# Patient Record
Sex: Male | Born: 1995 | Race: Black or African American | Hispanic: No
Health system: Western US, Academic
[De-identification: ages and names within clinical notes are randomized; demographics above are authoritative.]

## PROBLEM LIST (undated history)

## (undated) ENCOUNTER — Emergency Department (HOSPITAL_COMMUNITY): Admission: EM | Payer: 59 | Source: Home / Self Care

## (undated) DIAGNOSIS — J45909 Unspecified asthma, uncomplicated: Secondary | ICD-10-CM

## (undated) DIAGNOSIS — J302 Other seasonal allergic rhinitis: Secondary | ICD-10-CM

---

## 2015-03-15 ENCOUNTER — Emergency Department (HOSPITAL_COMMUNITY)
Admission: EM | Admit: 2015-03-15 | Discharge: 2015-03-16 | Discharge status: Against Medical Advice | Payer: Self-pay | Attending: Emergency Medicine | Admitting: Emergency Medicine

## 2015-03-15 ENCOUNTER — Encounter (HOSPITAL_COMMUNITY): Payer: Self-pay

## 2015-03-15 ENCOUNTER — Emergency Department (HOSPITAL_COMMUNITY): Payer: Self-pay

## 2015-03-15 DIAGNOSIS — R1110 Vomiting, unspecified: Secondary | ICD-10-CM | POA: Insufficient documentation

## 2015-03-15 DIAGNOSIS — J45901 Unspecified asthma with (acute) exacerbation: Secondary | ICD-10-CM | POA: Insufficient documentation

## 2015-03-15 HISTORY — DX: Unspecified asthma, uncomplicated: J45.909

## 2015-03-15 NOTE — ED Notes (Signed)
Pt presents with c/o shortness of breath that he reported started at work earlier today. Pt reports a hx of asthma, lung sounds clear at this time. Pt denies any hx of anxiety but appears to be very anxious in triage. Pt reports that it has been harder for him to breathe lately and he has been using his dads inhaler. Pt also c/o vomiting.

## 2016-08-11 ENCOUNTER — Emergency Department (HOSPITAL_COMMUNITY): Payer: Medicaid Other

## 2016-08-11 ENCOUNTER — Encounter (HOSPITAL_COMMUNITY): Payer: Self-pay

## 2016-08-11 ENCOUNTER — Emergency Department (HOSPITAL_COMMUNITY)
Admission: EM | Admit: 2016-08-11 | Discharge: 2016-08-11 | Disposition: A | Discharge status: Home / Self Care | Payer: Medicaid Other | Attending: Emergency Medicine | Admitting: Emergency Medicine

## 2016-08-11 DIAGNOSIS — J4520 Mild intermittent asthma, uncomplicated: Secondary | ICD-10-CM | POA: Insufficient documentation

## 2016-08-11 DIAGNOSIS — R0602 Shortness of breath: Secondary | ICD-10-CM | POA: Diagnosis present

## 2016-08-11 HISTORY — DX: Other seasonal allergic rhinitis: J30.2

## 2016-08-11 LAB — BASIC METABOLIC PANEL
Anion gap: 10 (ref 5–15)
BUN: 7 mg/dL (ref 6–20)
CHLORIDE: 109 mmol/L (ref 101–111)
CO2: 22 mmol/L (ref 22–32)
CREATININE: 1.1 mg/dL (ref 0.61–1.24)
Calcium: 8.5 mg/dL — ABNORMAL LOW (ref 8.9–10.3)
Glucose, Bld: 110 mg/dL — ABNORMAL HIGH (ref 65–99)
POTASSIUM: 3.1 mmol/L — AB (ref 3.5–5.1)
SODIUM: 141 mmol/L (ref 135–145)

## 2016-08-11 LAB — CBC
HCT: 44.4 % (ref 39.0–52.0)
Hemoglobin: 16.4 g/dL (ref 13.0–17.0)
MCH: 32.7 pg (ref 26.0–34.0)
MCHC: 36.9 g/dL — ABNORMAL HIGH (ref 30.0–36.0)
MCV: 88.4 fL (ref 78.0–100.0)
PLATELETS: 182 10*3/uL (ref 150–400)
RBC: 5.02 MIL/uL (ref 4.22–5.81)
RDW: 12.4 % (ref 11.5–15.5)
WBC: 5.6 10*3/uL (ref 4.0–10.5)

## 2016-08-11 MED ORDER — METHYLPREDNISOLONE SODIUM SUCC 125 MG IJ SOLR
125.0000 mg | Freq: Once | INTRAMUSCULAR | Status: AC
  Administered 2016-08-11: 125 mg via INTRAVENOUS
  Filled 2016-08-11: qty 2

## 2016-08-11 MED ORDER — SODIUM CHLORIDE 0.9 % IV BOLUS (SEPSIS)
1000.0000 mL | Freq: Once | INTRAVENOUS | Status: AC
  Administered 2016-08-11: 1000 mL via INTRAVENOUS

## 2016-08-11 MED ORDER — DEXAMETHASONE SODIUM PHOSPHATE 4 MG/ML IJ SOLN: 16.0000 mg | Freq: Once | INTRAMUSCULAR | Status: DC

## 2016-08-11 MED ORDER — ALBUTEROL SULFATE (2.5 MG/3ML) 0.083% IN NEBU
5.0000 mg | INHALATION_SOLUTION | Freq: Once | RESPIRATORY_TRACT | Status: AC
  Administered 2016-08-11: 5 mg via RESPIRATORY_TRACT
  Filled 2016-08-11: qty 6

## 2016-08-11 MED ORDER — ALBUTEROL SULFATE HFA 108 (90 BASE) MCG/ACT IN AERS
1.0000 | INHALATION_SPRAY | Freq: Once | RESPIRATORY_TRACT | Status: AC
  Administered 2016-08-11: 1 via RESPIRATORY_TRACT
  Filled 2016-08-11: qty 6.7

## 2016-08-11 MED ORDER — POTASSIUM CHLORIDE CRYS ER 20 MEQ PO TBCR
40.0000 meq | EXTENDED_RELEASE_TABLET | Freq: Once | ORAL | Status: AC
  Administered 2016-08-11: 40 meq via ORAL
  Filled 2016-08-11: qty 2

## 2016-08-11 MED ORDER — PREDNISONE 20 MG PO TABS: 60.0000 mg | ORAL_TABLET | Freq: Every day | ORAL | 0 refills | Status: DC

## 2016-08-11 MED ORDER — IPRATROPIUM BROMIDE 0.02 % IN SOLN
0.5000 mg | Freq: Once | RESPIRATORY_TRACT | Status: AC
  Administered 2016-08-11: 0.5 mg via RESPIRATORY_TRACT
  Filled 2016-08-11: qty 2.5

## 2016-08-11 MED ORDER — ALBUTEROL (5 MG/ML) CONTINUOUS INHALATION SOLN
10.0000 mg/h | INHALATION_SOLUTION | Freq: Once | RESPIRATORY_TRACT | Status: AC
  Administered 2016-08-11: 10 mg/h via RESPIRATORY_TRACT
  Filled 2016-08-11: qty 20

## 2016-08-11 NOTE — ED Notes (Signed)
Pt was walked to radiology and when he was walking back he began having sob with some coughing.  Pt was helped back to room with a wheelchair.  EDPA notified.  Pt HR 140, spo2 100%. EDPA to the bedside and notified me that pt needs to be moved to the back

## 2016-08-11 NOTE — ED Notes (Signed)
O2 saturation was 97-98% while ambulating

## 2016-08-11 NOTE — Progress Notes (Signed)
Patient given  of albuterol. Patient told this RT that he took 4-6 puffs of albuterol inhaler prior to coming to ED.

## 2016-08-11 NOTE — ED Notes (Signed)
Pt is sweaty, and tachypneic, he has intermittent coughing and is on all 4s in the room, he is able to answer me in one word answers.  Spo2 is 100%.  Pt has numbness and stiffness in his hands, mask given and advised pt to attempt to slow his breathing to avoid this.  Pt helped into wheelchair and to room 3.  On arrival to room 3 pt is already some calmer and HR is down

## 2016-08-11 NOTE — ED Notes (Signed)
Asthma attack, adjusted acuity

## 2016-08-11 NOTE — ED Provider Notes (Signed)
[facility]-EMERGENCY DEPT Provider Note   CSN: [id number] Arrival date & time: 08/11/16  1214     History   Chief Complaint Chief Complaint  Patient presents with  . Asthma  . Shortness of Breath    HPI Dustin Christian is a 21 y.o. male.  HPI  21 y.o. male with a hx of Asthma, presents to the Emergency Department today complaining of shortness of breath x 3 weeks. Worsening last night. Notes asthma attacks x 3 with use of parents Symbicort to help with symptoms with minimal relief. Denies URI symptoms. No cough/congestion. No N/V/D. No active chest pain. No other symptoms noted. Difficult to obtain HPI due to acuity of condition. Pt tripoding with accessory muscle usage.   Past Medical History:  Diagnosis Date  . Asthma   . Seasonal allergies     There are no active problems to display for this patient.   History reviewed. No pertinent surgical history.     Home Medications    Prior to Admission medications   Not on File    Family History History reviewed. No pertinent family history.  Social History Social History  Substance Use Topics  . Smoking status: Never Smoker  . Smokeless tobacco: Never Used  . Alcohol use No     Allergies   Patient has no known allergies.   Review of Systems Review of Systems ROS reviewed and all are negative for acute change except as noted in the HPI.  Physical Exam Updated Vital Signs BP (!) 135/96   Pulse (!) 126   Temp 98.2 F (36.8 C) (Oral)   Resp (!) 22   Ht  (1.753 m)   Wt 77.1 kg   SpO2 99%   BMI 25.10 kg/m   Physical Exam  Constitutional: He is oriented to person, place, and time. Vital signs are normal. He appears well-developed and well-nourished. No distress.  Tripoding. Accessory muscle usage   HENT:  Head: Normocephalic and atraumatic.  Right Ear: Hearing, tympanic membrane, external ear and ear canal normal.  Left Ear: Hearing, tympanic membrane, external ear and ear canal normal.  Nose: Nose  normal.  Mouth/Throat: Uvula is midline, oropharynx is clear and moist and mucous membranes are normal. No trismus in the jaw. No oropharyngeal exudate, posterior oropharyngeal erythema or tonsillar abscesses.  Eyes: Conjunctivae and EOM are normal. Pupils are equal, round, and reactive to light.  Neck: Normal range of motion. Neck supple. No tracheal deviation present.  Cardiovascular: Regular rhythm, S1 normal, S2 normal, normal heart sounds, intact distal pulses and normal pulses.  Tachycardia present.   Pulmonary/Chest: Accessory muscle usage present. Tachypnea noted. He is in respiratory distress. He has decreased breath sounds in the right upper field, the right lower field, the left upper field and the left lower field. He has no wheezes. He has no rhonchi. He has no rales.  Abdominal: Normal appearance and bowel sounds are normal. There is no tenderness.  Musculoskeletal: Normal range of motion.  Neurological: He is alert and oriented to person, place, and time.  Skin: Skin is warm and dry.  Psychiatric: He has a normal mood and affect. His speech is normal and behavior is normal. Thought content normal.  Nursing note and vitals reviewed.  ED Treatments / Results  Labs (all labs ordered are listed, but only abnormal results are displayed) Labs Reviewed  CBC - Abnormal; Notable for the following:       Result Value   MCHC 36.9 (*)  All other components within normal limits  BASIC METABOLIC PANEL    EKG  EKG Interpretation None       Radiology Dg Chest 2 View  Result Date: 08/11/2016 CLINICAL DATA:  History of asthma.  Shortness of breath for 1 day. EXAM: CHEST  2 VIEW COMPARISON:  03/15/2015 FINDINGS: Heart and mediastinal contours are within normal limits. No focal opacities or effusions. No acute bony abnormality. IMPRESSION: No active cardiopulmonary disease. Electronically Signed   By: Charlett Nose M.D.   On: 08/11/2016 13:28    Procedures Procedures (including  critical care time)  CRITICAL CARE Performed by: Eston Esters   Total critical care time: 35 minutes  Critical care time was exclusive of separately billable procedures and treating other patients.  Critical care was necessary to treat or prevent imminent or life-threatening deterioration.  Critical care was time spent personally by me on the following activities: development of treatment plan with patient and/or surrogate as well as nursing, discussions with consultants, evaluation of patient's response to treatment, examination of patient, obtaining history from patient or surrogate, ordering and performing treatments and interventions, ordering and review of laboratory studies, ordering and review of radiographic studies, pulse oximetry and re-evaluation of patient's condition.   Medications Ordered in ED Medications  albuterol (PROVENTIL HFA;VENTOLIN HFA) 108 (90 Base) MCG/ACT inhaler 1 puff (not administered)  sodium chloride 0.9 % bolus 1,000 mL (not administered)  albuterol (PROVENTIL) (2.5 MG/3ML) 0.083% nebulizer solution 5 mg (5 mg Nebulization Given 08/11/16 1238)  methylPREDNISolone sodium succinate (SOLU-MEDROL) 125 mg/2 mL injection 125 mg (125 mg Intravenous Given 08/11/16 1356)  albuterol (PROVENTIL,VENTOLIN) solution continuous neb (10 mg/hr Nebulization Given 08/11/16 1332)  sodium chloride 0.9 % bolus 1,000 mL (1,000 mLs Intravenous New Bag/Given 08/11/16 1356)  ipratropium (ATROVENT) nebulizer solution 0.5 mg (0.5 mg Nebulization Given 08/11/16 1332)    Initial Impression / Assessment and Plan / ED Course  I have reviewed the triage vital signs and the nursing notes.  Pertinent labs & imaging results that were available during my care of the patient were reviewed by me and considered in my medical decision making (see chart for details).  Final Clinical Impressions(s) / ED Diagnoses  {I have reviewed and evaluated the relevant laboratory values. {I have reviewed and  evaluated the relevant imaging studies.  {I have reviewed the relevant previous healthcare records.  {I obtained HPI from historian.   ED Course:  Assessment: Pt is a 21 y.o. male with hx Asthma who presents with acute asthma exacerbation x last night. Notes symptoms x2-3 weeks. Attempted relief with parents symbicort. On exam, Tripoding with active WOB. Accessory muscle usage. VS with tachyardia. Afebrile. Lungs decreased breath sounds bilaterally. Wheeze noted on triage with administration of albuterol. Abdomen nontender soft. Given continuous neb treatment, solumedrol with significant improvement of accessory muscle use and tripoding. CBC unremarkable. BMP pending. CXR shows no active cardiopulmonary disease. Discussed with supervising physician. Anticipate DC home. Given fluids IV and PO to get HR down due to albuterol administration. Pt well appearing and ambulating at 94-96% on RA. Doubt ACS or PE as patient improved significantly with breathing treatments. If HR goes down, DC home with steroids and albuterol. If unchanged, will admit to medicine for observation. Pt does state that he has been admitted in the past for asthma exacerbations. Seen by supervising physician who agrees with plan.   Disposition/Plan:  3:01 PM Sign out: Shanna Cisco, PA-C  Supervising Physician Derwood Kaplan, MD  Final diagnoses:  Mild  intermittent asthma, unspecified whether complicated    New Prescriptions New Prescriptions   No medications on file     Audry Pili, PA-C 08/11/16 1521    Derwood Kaplan, MD 08/12/16 0900    Derwood Kaplan, MD 08/12/16 [phone]

## 2016-08-11 NOTE — Discharge Instructions (Signed)
Please read and follow all provided instructions.  Your diagnoses today include:  1. Mild intermittent asthma, unspecified whether complicated    Tests performed today include: Chest Xray Vital signs. See below for your results today.   Medications prescribed:   Take any prescribed medications only as directed.  Home care instructions:  Follow any educational materials contained in this packet.  Follow-up instructions: Please follow-up with your primary care provider in the next 3 days for further evaluation of your symptoms and management of your asthma.  Return instructions:  Please return to the Emergency Department if you experience worsening symptoms. Please return with worsening wheezing, shortness of breath, or difficulty breathing. Return with persistent fever above 101F.  Please return if you have any other emergent concerns.  Additional Information:  Your vital signs today were: BP (!) 135/96    Pulse (!) 126    Temp 98.2 F (36.8 C) (Oral)    Resp (!) 22    Ht  (1.753 m)    Wt 77.1 kg    SpO2 99%    BMI 25.10 kg/m  If your blood pressure (BP) was elevated above 135/85 this visit, please have this repeated by your doctor within one month. --------------

## 2016-08-11 NOTE — ED Provider Notes (Signed)
Care assumed from previous provider PA [facility]. Please see note for further details. Briefly, patient is a 21 y.o. male with history of asthma who presents to ED for shortness of breath c/w asthma exacerbation. Given steroids and continuous neb by prior provider. Patient feeling better. BMP pending. HR in 150's. Case discussed, plan agreed upon. Will follow up on BMP, re-evaluate after 1L fluids. If HR improved and able to ambulate without desat, likely home with rx for steroid burst and inhaler.   BMP with K+ of 3.1, otherwise reassuring. K+ replenished.   Patient re-evaluated. HR 105-112. Feels improved and comfortable with discharge to home. Father comfortable with discharge home as well. Rx for steroid burst given. Reasons to return to ER discussed and all questions answered.    [facility] Yanelis Osika, PA-C 08/11/16 [phone]

## 2016-08-11 NOTE — ED Triage Notes (Signed)
Pt with hx of asthma.  No inhaler.  Shortness of breath last night and today with described asthma attack.  Used dad's inhaler.

## 2016-08-11 NOTE — ED Notes (Signed)
Family at bedside. 

## 2016-09-20 ENCOUNTER — Ambulatory Visit: Payer: Self-pay | Admitting: Family Medicine

## 2017-03-27 ENCOUNTER — Other Ambulatory Visit: Payer: Self-pay

## 2017-03-27 ENCOUNTER — Emergency Department (HOSPITAL_COMMUNITY): Payer: Self-pay

## 2017-03-27 ENCOUNTER — Encounter (HOSPITAL_COMMUNITY): Payer: Self-pay | Admitting: Emergency Medicine

## 2017-03-27 ENCOUNTER — Emergency Department (HOSPITAL_COMMUNITY)
Admission: EM | Admit: 2017-03-27 | Discharge: 2017-03-27 | Disposition: A | Discharge status: Home / Self Care | Payer: Self-pay | Attending: Emergency Medicine | Admitting: Emergency Medicine

## 2017-03-27 DIAGNOSIS — W010XXA Fall on same level from slipping, tripping and stumbling without subsequent striking against object, initial encounter: Secondary | ICD-10-CM | POA: Insufficient documentation

## 2017-03-27 DIAGNOSIS — S6991XA Unspecified injury of right wrist, hand and finger(s), initial encounter: Secondary | ICD-10-CM | POA: Insufficient documentation

## 2017-03-27 DIAGNOSIS — J45909 Unspecified asthma, uncomplicated: Secondary | ICD-10-CM | POA: Insufficient documentation

## 2017-03-27 DIAGNOSIS — Y92481 Parking lot as the place of occurrence of the external cause: Secondary | ICD-10-CM | POA: Insufficient documentation

## 2017-03-27 DIAGNOSIS — M25531 Pain in right wrist: Secondary | ICD-10-CM | POA: Insufficient documentation

## 2017-03-27 DIAGNOSIS — Y998 Other external cause status: Secondary | ICD-10-CM | POA: Insufficient documentation

## 2017-03-27 DIAGNOSIS — Z79899 Other long term (current) drug therapy: Secondary | ICD-10-CM | POA: Insufficient documentation

## 2017-03-27 DIAGNOSIS — Y9389 Activity, other specified: Secondary | ICD-10-CM | POA: Insufficient documentation

## 2017-03-27 MED ORDER — IBUPROFEN 200 MG PO TABS
600.0000 mg | ORAL_TABLET | Freq: Once | ORAL | Status: AC
  Administered 2017-03-27: 600 mg via ORAL
  Filled 2017-03-27: qty 3

## 2017-03-27 NOTE — Discharge Instructions (Signed)
Please read instructions below. Apply ice to your wrist for 20 minutes at a time. You can take advil/ibuprofen every 6 hours as needed for pain. Schedule an appointment with the orthopedic specialist in 1 week if symptoms persist.  Return to the ER for new or concerning symptoms.

## 2017-03-27 NOTE — ED Notes (Signed)
Bed: WTR5 Expected date:  Expected time:  Means of arrival:  Comments: 21 yo wrist pain

## 2017-03-27 NOTE — ED Provider Notes (Signed)
[facility]-EMERGENCY DEPT Provider Note   CSN: [id number] Arrival date & time: 03/27/17  1446     History   Chief Complaint Chief Complaint  Patient presents with  . Wrist Pain    HPI Dustin Christian is a 21 y.o. male to the ED with acute onset of right wrist pain began prior to arrival.  Patient states he was pushing a car out of the snow and slipped and fell down onto his right side with his right arm tucked under him. No head trauma. He localizes pain to the medial aspect of the right wrist and distal forearm.  Pain is worse with movement and palpation.  No medications tried prior to arrival.  Denies numbness or tingling.  States he is right-hand dominant.  No previous injuries to this arm. The history is provided by the patient.    Past Medical History:  Diagnosis Date  . Asthma   . Seasonal allergies     There are no active problems to display for this patient.   History reviewed. No pertinent surgical history.     Home Medications    Prior to Admission medications   Medication Sig Start Date End Date Taking? Authorizing Provider  albuterol (PROVENTIL HFA;VENTOLIN HFA) 108 (90 Base) MCG/ACT inhaler Inhale 2 puffs into the lungs every 6 (six) hours as needed for wheezing or shortness of breath.    [provider]  diphenhydrAMINE (BENADRYL) 25 MG tablet Take 50 mg by mouth every 6 (six) hours as needed for itching or allergies.    [provider]  predniSONE (DELTASONE) 20 MG tablet Take 3 tablets (60 mg total) by mouth daily. 08/11/16   Ward, Chase PicketJaime Pilcher, PA-C    Family History No family history on file.  Social History Social History   Tobacco Use  . Smoking status: Never Smoker  . Smokeless tobacco: Never Used  Substance Use Topics  . Alcohol use: No  . Drug use: No     Allergies   Patient has no known allergies.   Review of Systems Review of Systems  Musculoskeletal: Positive for arthralgias.  Neurological:  Negative for numbness.     Physical Exam Updated Vital Signs BP 109/61 (BP Location: Left Arm)   Pulse 81   Temp 98.7 F (37.1 C) (Oral)   Resp 13   Ht 5\' 10"  (1.778 m)   Wt 81.6 kg (180 lb)   SpO2 99%   BMI 25.83 kg/m   Physical Exam  Constitutional: He appears well-developed and well-nourished. No distress.  HENT:  Head: Normocephalic and atraumatic.  Eyes: Conjunctivae are normal.  Cardiovascular: Normal rate and intact distal pulses.  Pulmonary/Chest: Effort normal.  Abdominal: Soft.  Musculoskeletal:  Ulnar styloid with TTP, distal Ulnar shaft and 5th metacarpal  with TTP. No significant edema. No deformity. No wound. No color change. Good capillary refill. Normal ROM of all digits. Normal internal/external rotation of forearm, elbow with nl ROM.   Neurological: He is alert. No sensory deficit.  Skin: Skin is warm.  Psychiatric: He has a normal mood and affect. His behavior is normal.  Nursing note and vitals reviewed.    ED Treatments / Results  Labs (all labs ordered are listed, but only abnormal results are displayed) Labs Reviewed - No data to display  EKG  EKG Interpretation None       Radiology Dg Forearm Right  Result Date: 03/27/2017 CLINICAL DATA:  Was pushing a car and fell onto his RIGHT-side, pain and  numbness from fingers into forearm EXAM: RIGHT FOREARM - 2 VIEW COMPARISON:  None FINDINGS: Osseous mineralization normal. Joint spaces preserved. No fracture, dislocation, or bone destruction. IMPRESSION: Normal exam. Electronically Signed   By: Ulyses SouthwardMark  Boles M.D.   On: 03/27/2017 15:30   Dg Wrist Complete Right  Result Date: 03/27/2017 CLINICAL DATA:  Was pushing a car and fell onto his RIGHT-side, pain and numbness from fingers into forearm EXAM: RIGHT WRIST - COMPLETE 3+ VIEW COMPARISON:  None FINDINGS: Osseous mineralization normal. Joint spaces preserved. No acute fracture, dislocation or bone destruction. Oblique positioning on lateral view.  IMPRESSION: No acute osseous abnormalities. Electronically Signed   By: Ulyses SouthwardMark  Boles M.D.   On: 03/27/2017 15:30    Procedures Procedures (including critical care time)  Medications Ordered in ED Medications  ibuprofen (ADVIL,MOTRIN) tablet 600 mg (not administered)     Initial Impression / Assessment and Plan / ED Course  I have reviewed the triage vital signs and the nursing notes.  Pertinent labs & imaging results that were available during my care of the patient were reviewed by me and considered in my medical decision making (see chart for details).    Pt with R wrist pain s/p mechanical fall. NV intact. X-Ray negative for obvious fracture or dislocation. Pain managed in ED. Pt advised to follow up with orthopedics if symptoms persist for possibility of missed fracture diagnosis. Patient given brace while in ED, conservative therapy recommended and discussed. Patient will be dc home & is agreeable with above plan.  Discussed results, findings, treatment and follow up. Patient advised of return precautions. Patient verbalized understanding and agreed with plan.  Final Clinical Impressions(s) / ED Diagnoses   Final diagnoses:  Right wrist pain    ED Discharge Orders    None       Akanksha Bellmore, [place] N, PA-C 03/27/17 1537    Doug SouJacubowitz, Sam, MD 03/27/17 1557

## 2017-03-27 NOTE — ED Triage Notes (Signed)
Per EMS complaint of of right wrist pain from landing on arm while trying to plow car.

## 2017-03-27 NOTE — ED Notes (Addendum)
Pt is alert and oriented x 4 and is verbally responsive. Pt states that he was pushing a car and fell on the rt side of his body and is now having pain, numbness from his fingers to his forearm. Pt report 5/10 throbbing pain. +PMS to RUE. Pt has sling and immobile splint on rt forearm

## 2018-08-16 ENCOUNTER — Emergency Department (HOSPITAL_COMMUNITY): Payer: Medicaid Other

## 2018-08-16 ENCOUNTER — Other Ambulatory Visit: Payer: Self-pay

## 2018-08-16 ENCOUNTER — Encounter (HOSPITAL_COMMUNITY): Payer: Self-pay

## 2018-08-16 ENCOUNTER — Emergency Department (HOSPITAL_COMMUNITY)
Admission: EM | Admit: 2018-08-16 | Discharge: 2018-08-16 | Disposition: A | Discharge status: Home / Self Care | Payer: Medicaid Other | Attending: Emergency Medicine | Admitting: Emergency Medicine

## 2018-08-16 DIAGNOSIS — Z79899 Other long term (current) drug therapy: Secondary | ICD-10-CM | POA: Insufficient documentation

## 2018-08-16 DIAGNOSIS — J45909 Unspecified asthma, uncomplicated: Secondary | ICD-10-CM | POA: Insufficient documentation

## 2018-08-16 DIAGNOSIS — Z20828 Contact with and (suspected) exposure to other viral communicable diseases: Secondary | ICD-10-CM | POA: Insufficient documentation

## 2018-08-16 DIAGNOSIS — R0602 Shortness of breath: Secondary | ICD-10-CM | POA: Insufficient documentation

## 2018-08-16 DIAGNOSIS — R0789 Other chest pain: Secondary | ICD-10-CM | POA: Insufficient documentation

## 2018-08-16 DIAGNOSIS — R05 Cough: Secondary | ICD-10-CM | POA: Insufficient documentation

## 2018-08-16 LAB — CBC
HCT: 45.3 % (ref 39.0–52.0)
Hemoglobin: 16.2 g/dL (ref 13.0–17.0)
MCH: 32.3 pg (ref 26.0–34.0)
MCHC: 35.8 g/dL (ref 30.0–36.0)
MCV: 90.4 fL (ref 80.0–100.0)
Platelets: 268 10*3/uL (ref 150–400)
RBC: 5.01 MIL/uL (ref 4.22–5.81)
RDW: 11.7 % (ref 11.5–15.5)
WBC: 3.9 10*3/uL — ABNORMAL LOW (ref 4.0–10.5)
nRBC: 0 % (ref 0.0–0.2)

## 2018-08-16 LAB — BASIC METABOLIC PANEL
Anion gap: 9 (ref 5–15)
BUN: 6 mg/dL (ref 6–20)
CO2: 23 mmol/L (ref 22–32)
Calcium: 9.4 mg/dL (ref 8.9–10.3)
Chloride: 106 mmol/L (ref 98–111)
Creatinine, Ser: 1.08 mg/dL (ref 0.61–1.24)
GFR calc Af Amer: >60 mL/min (ref >60)
GFR calc non Af Amer: >60 mL/min (ref >60)
Glucose, Bld: 104 mg/dL — ABNORMAL HIGH (ref 70–99)
Potassium: 4.3 mmol/L (ref 3.5–5.1)
Sodium: 138 mmol/L (ref 135–145)

## 2018-08-16 LAB — TROPONIN I: Troponin I: <0.03 ng/mL (ref <0.03)

## 2018-08-16 MED ORDER — ALBUTEROL SULFATE HFA 108 (90 BASE) MCG/ACT IN AERS: 2.0000 | INHALATION_SPRAY | Freq: Four times a day (QID) | RESPIRATORY_TRACT | 0 refills | Status: DC | PRN

## 2018-08-16 MED ORDER — ALBUTEROL SULFATE HFA 108 (90 BASE) MCG/ACT IN AERS
2.0000 | INHALATION_SPRAY | Freq: Once | RESPIRATORY_TRACT | Status: AC
  Administered 2018-08-16: 2 via RESPIRATORY_TRACT
  Filled 2018-08-16: qty 6.7

## 2018-08-16 NOTE — ED Provider Notes (Signed)
[facility] EMERGENCY DEPARTMENT Provider Note   CSN: [id number] Arrival date & time: 08/16/18  1909    History   Chief Complaint Chief Complaint  Patient presents with  . Shortness of Breath    HPI Jet Wagley is a 23 y.o. male with a PMH of Asthma and seasonal allergies presenting with intermittent shortness of breath onset 1 week ago. Patient reports an associated productive cough. Patient states he has been using nebulizer with relief. Patient states he ran out of his albuterol inhaler recently. Patient states smelling things makes symptoms worse. Patient reports intermittent mild central chest pressure. Patient denies fever, chills, nausea, vomiting, abdominal pain, diarrhea, or body aches. Patient denies congestion, rhinorrhea, or sore throat. Patient reports sick exposures at work with possible COVID-19. Patient denies recent travel. Patient denies using recent surgery, leg edema/pain, or hx of DVT/PE. Patient denies a personal or family history of heart problems.       HPI  Past Medical History:  Diagnosis Date  . Asthma   . Seasonal allergies     There are no active problems to display for this patient.   History reviewed. No pertinent surgical history.      Home Medications    Prior to Admission medications   Medication Sig Start Date End Date Taking? Authorizing Provider  albuterol (VENTOLIN HFA) 108 (90 Base) MCG/ACT inhaler Inhale 2 puffs into the lungs every 6 (six) hours as needed for wheezing or shortness of breath. 08/16/18   Ardyth Harps, Tiffiney Sparrow P, PA-C  diphenhydrAMINE (BENADRYL) 25 MG tablet Take 50 mg by mouth every 6 (six) hours as needed for itching or allergies.    [provider]  predniSONE (DELTASONE) 20 MG tablet Take 3 tablets (60 mg total) by mouth daily. 08/11/16   Ward, Chase Picket, PA-C    Family History History reviewed. No pertinent family history.  Social History Social History   Tobacco Use  . Smoking  status: Never Smoker  . Smokeless tobacco: Never Used  Substance Use Topics  . Alcohol use: No  . Drug use: No     Allergies   Patient has no known allergies.   Review of Systems Review of Systems  Constitutional: Negative for activity change, appetite change, chills, diaphoresis, fatigue, fever and unexpected weight change.  HENT: Negative for congestion and rhinorrhea.   Respiratory: Positive for cough and shortness of breath. Negative for chest tightness and wheezing.   Cardiovascular: Positive for chest pain. Negative for palpitations and leg swelling.  Gastrointestinal: Negative for abdominal pain, nausea and vomiting.  Endocrine: Negative for cold intolerance and heat intolerance.  Musculoskeletal: Negative for back pain.  Skin: Negative for rash.  Allergic/Immunologic: Negative for immunocompromised state.  Neurological: Negative for dizziness, syncope, weakness and light-headedness.  Psychiatric/Behavioral: Negative for agitation and behavioral problems. The patient is not nervous/anxious.    Physical Exam Updated Vital Signs BP 112/66   Pulse 64   Temp 98 F (36.7 C) (Oral)   Resp 16   Ht 5\' 11"  (1.803 m)   Wt 87.1 kg   SpO2 98%   BMI 26.78 kg/m   Physical Exam Vitals signs and nursing note reviewed.  Constitutional:      General: He is not in acute distress.    Appearance: He is well-developed. He is not diaphoretic.  HENT:     Head: Normocephalic and atraumatic.     Mouth/Throat:     Mouth: Mucous membranes are moist.     Pharynx: No pharyngeal  swelling or oropharyngeal exudate.  Neck:     Musculoskeletal: Normal range of motion and neck supple.     Vascular: No JVD.  Cardiovascular:     Rate and Rhythm: Normal rate and regular rhythm.     Pulses: Normal pulses.          Radial pulses are 2+ on the right side and 2+ on the left side.       Dorsalis pedis pulses are 2+ on the right side and 2+ on the left side.     Heart sounds: Normal heart sounds.  No murmur. No friction rub. No gallop.   Pulmonary:     Effort: Pulmonary effort is normal. No respiratory distress.     Breath sounds: Normal breath sounds. No decreased breath sounds, wheezing, rhonchi or rales.     Comments: Patient is speaking in full sentences without difficulty on room air. Oxygen saturation is 98% while in the room.  Chest:     Chest wall: No tenderness.  Abdominal:     Palpations: Abdomen is soft.     Tenderness: There is no abdominal tenderness.  Musculoskeletal: Normal range of motion.     Right lower leg: He exhibits no tenderness. No edema.     Left lower leg: He exhibits no tenderness. No edema.  Skin:    General: Skin is warm.     Capillary Refill: Capillary refill takes less than 2 seconds.     Coloration: Skin is not pale.     Findings: No rash.  Neurological:     Mental Status: He is alert.     ED Treatments / Results  Labs (all labs ordered are listed, but only abnormal results are displayed) Labs Reviewed  CBC - Abnormal; Notable for the following components:      Result Value   WBC 3.9 (*)    All other components within normal limits  BASIC METABOLIC PANEL - Abnormal; Notable for the following components:   Glucose, Bld 104 (*)    All other components within normal limits  TROPONIN I    EKG EKG Interpretation  Date/Time:  Wednesday August 16 2018 21:04:16 EDT Ventricular Rate:  61 PR Interval:    QRS Duration: 91 QT Interval:  375 QTC Calculation: 378 R Axis:   12 Text Interpretation:  Sinus rhythm ST elev, probable normal early repol pattern agree. appears similar to old except slower rate Confirmed by Arby BarrettePfeiffer, Marcy [phone]([id number]) on 08/16/2018 9:10:02 PM   Radiology Dg Chest 2 View  Result Date: 08/16/2018 CLINICAL DATA:  23 year old male with shortness of breath EXAM: CHEST - 2 VIEW COMPARISON:  08/11/2016 FINDINGS: The heart size and mediastinal contours are within normal limits. Both lungs are clear. The visualized skeletal  structures are unremarkable. IMPRESSION: Negative for acute cardiopulmonary disease Electronically Signed   By: Gilmer MorJaime  Wagner D.O.   On: 08/16/2018 19:55    Procedures Procedures (including critical care time)  Medications Ordered in ED Medications - No data to display   Initial Impression / Assessment and Plan / ED Course  I have reviewed the triage vital signs and the nursing notes.  Pertinent labs & imaging results that were available during my care of the patient were reviewed by me and considered in my medical decision making (see chart for details).  Clinical Course as of Aug 15 2336  Wed Aug 16, 2018  2137 No acute cardiopulmonary disease noted on CXR.  DG Chest 2 View [AH]    Clinical Course  User Index [AH] Leretha Dykes, PA-C      Patient is to be discharged with recommendation to follow up with PCP in regards to today's hospital visit. Shortness of breath and chest pain is not likely of cardiac or pulmonary etiology d/t presentation, PERC negative, VSS, no tracheal deviation, no JVD or new murmur, RRR, breath sounds equal bilaterally, EKG without acute abnormalities, negative troponin, and negative CXR. Pt has been advised to return to the ED if CP becomes exertional, associated with diaphoresis or nausea, radiates to left jaw/arm, worsens or becomes concerning in any way. Suspect symptoms are likely viral in nature. Oxygen saturations have remained stable while in the ER. Advised patient to quarantine until symptoms resolve due to possible COVID-19 exposures. Refilled albuterol inhaler per patient's request. Discussed strict return precautions. Patient states he understands and agrees with plan. Pt appears reliable for follow up and is agreeable to discharge.   Charvez Voorhies was evaluated in Emergency Department on 08/16/2018 for the symptoms described in the history of present illness. He was evaluated in the context of the global COVID-19 pandemic, which necessitated  consideration that the patient might be at risk for infection with the SARS-CoV-2 virus that causes COVID-19. Institutional protocols and algorithms that pertain to the evaluation of patients at risk for COVID-19 are in a state of rapid change based on information released by regulatory bodies including the CDC and federal and state organizations. These policies and algorithms were followed during the patient's care in the ED.  Final Clinical Impressions(s) / ED Diagnoses   Final diagnoses:  Shortness of breath  Atypical chest pain    ED Discharge Orders         Ordered    albuterol (VENTOLIN HFA) 108 (90 Base) MCG/ACT inhaler  Every 6 hours PRN     08/16/18 2337           Leretha Dykes, PA-C 08/16/18 2339    Arby Barrette, MD 08/17/18 1728

## 2018-08-16 NOTE — ED Triage Notes (Signed)
The pt is c/o some sob for one week  Hx of asthma he does not have an inhaler he ran out  No wheezing heard  He has had a productive cough white yellow no elevated temp

## 2018-08-16 NOTE — ED Notes (Signed)
No distress.

## 2018-08-16 NOTE — ED Triage Notes (Signed)
Pt arrives pov for eval of SOB since last Thursday w/ cough/chills/chest pressure. Pt denies sick contacts or travel. Previous triage note charted in error under Dustin Christian, NT. Information is accurate, charted by this RN.

## 2018-08-16 NOTE — ED Triage Notes (Signed)
Pt arrives pov for eval of SOB since last Thursday w/ cough/chills/chest pressure. Pt denies sick contacts or travel.

## 2018-08-16 NOTE — Discharge Instructions (Addendum)
You have been seen today for shortness of breath and chest pressure. Please read and follow all provided instructions.   1. Medications: inhaler for shortness of breath, usual home medications 2. Treatment: rest, drink plenty of fluids 3. Follow Up: Please follow up with your primary doctor in 2 days for discussion of your diagnoses and further evaluation after today's visit; if you do not have a primary care doctor use the resource guide provided to find one; Please return to the ER for any new or worsening symptoms. Please obtain all of your results from medical records or have your doctors office obtain the results - share them with your doctor - you should be seen at your doctors office. Call today to arrange your follow up.  4. Please follow instructions for isolation. We did not test you for COVID-19 (coronavirus), but it is still a possibility that you may have been exposed. Please isolate yourself for at least 3 days since recovery without a fever, improvement in respiratory symptoms (cough, shortness of breath), and at least 7 days have passed since symptoms first appeared.    Take medications as prescribed. Please review all of the medicines and only take them if you do not have an allergy to them. Return to the emergency room for worsening condition or new concerning symptoms. Follow up with your regular doctor. If you don't have a regular doctor use one of the numbers below to establish a primary care doctor.  Please be aware that if you are taking birth control pills, taking other prescriptions, ESPECIALLY ANTIBIOTICS may make the birth control ineffective - if this is the case, either do not engage in sexual activity or use alternative methods of birth control such as condoms until you have finished the medicine and your family doctor says it is OK to restart them. If you are on a blood thinner such as COUMADIN, be aware that any other medicine that you take may cause the coumadin to either  work too much, or not enough - you should have your coumadin level rechecked in next 7 days if this is the case.  ?  It is also a possibility that you have an allergic reaction to any of the medicines that you have been prescribed - Everybody reacts differently to medications and while MOST people have no trouble with most medicines, you may have a reaction such as nausea, vomiting, rash, swelling, shortness of breath. If this is the case, please stop taking the medicine immediately and contact your physician.  ?  You should return to the ER if you develop severe or worsening symptoms.   Emergency Department Resource Guide 1) Find a Doctor and Pay Out of Pocket Although you won't have to find out who is covered by your insurance plan, it is a good idea to ask around and get recommendations. You will then need to call the office and see if the doctor you have chosen will accept you as a new patient and what types of options they offer for patients who are self-pay. Some doctors offer discounts or will set up payment plans for their patients who do not have insurance, but you will need to ask so you aren't surprised when you get to your appointment.  2) Contact Your Local Health Department Not all health departments have doctors that can see patients for sick visits, but many do, so it is worth a call to see if yours does. If you don't know where your local health department  is, you can check in your phone book. The CDC also has a tool to help you locate your state's health department, and many state websites also have listings of all of their local health departments.  3) Find a Walk-in Clinic If your illness is not likely to be very severe or complicated, you may want to try a walk in clinic. These are popping up all over the country in pharmacies, drugstores, and shopping centers. They're usually staffed by nurse practitioners or physician assistants that have been trained to treat common illnesses  and complaints. They're usually fairly quick and inexpensive. However, if you have serious medical issues or chronic medical problems, these are probably not your best option.  No Primary Care Doctor: Call Health Connect at  [phone] - they can help you locate a primary care doctor that  accepts your insurance, provides certain services, etc. Physician Referral Service- [phone]  Chronic Pain Problems: Organization         Address  Phone   Notes  [facility]  [phone] Patients need to be referred by their primary care doctor.   Medication Assistance: Organization         Address  Phone   Notes  [facility] Medication [facility] [address] [place], [place] [address] [phone] --Must be a resident of [place] -- Must have NO insurance coverage whatsoever (no Medicaid/ Medicare, etc.) -- The pt. MUST have a primary care doctor that directs their care regularly and follows them in the community   [facility]  [phone]   [employer]  [phone]    Agencies that provide inexpensive medical care: Organization         Address  Phone   Notes  [facility]  [phone]   [facility]    [phone]   [facility] [address] [place], [place] [address] [phone]) [phone]   [facility] [address], [place] [phone]) [phone]   [facility]    [phone]) [phone]   [facility]    [phone]   [facility]  [address], [place] Phone:  [phone], Fax:  [phone] Hours of Operation:  9 am - 6 pm, M-F.  Also accepts Medicaid/Medicare and self-pay.  [facility]  [address], [place] Phone: [phone], Fax: [phone]) [phone]. Hours of Operation:  8:30 am - 5:30 pm, M-F.  Also accepts Medicaid and self-pay.  [facility] [place] [address], [place] Phone: [phone]) [phone]   [facility] [address], [place], [place] [phone], Ext. [phone] Mondays & Thursdays: 7-9 AM.  First 15 patients are seen on a first come, first serve basis.    Medicaid-accepting [facility] Providers:  Organization         Address  Phone   Notes  [facility] [address], [place] [phone] Also accepts self-pay patients.  [facility] [address], [place]  [phone]   [facility] [address], [place] [phone]   [facility] [address] [place] [address], [place] [phone]   Renaye RakersVeita Bland [address], [place]   [phone]) [phone] Only accepts [place] patients after they have their name applied  to their card.   Self-Pay (no insurance) in [facility]:  Organization         Address  Phone   Notes  Sickle Cell Patients, [facility] [address] [place], [place] [phone]   [facility] [address] [place], [place] [phone]   [facility] [place]  [address], [place] [phone]   [facility]/Dr. Osei-Bonsu  [address], [place] or [address], [place] [phone] Phone number for both [place] and [place] locations is the same.  [facility] [address] [place] [phone]   [facility] [address] [place] or [address] [phone]   [facility] [address] [place] [phone], phone; [phone], fax Sees patients 1st and 3rd Saturday of every month.  Must not qualify for public or private insurance (i.e. Medicaid, Medicare, [place] Health Choice, Veterans' Benefits)  Household income should be no more than 200% of the poverty level The  clinic cannot treat you if you are pregnant or think you are pregnant  Sexually transmitted diseases are not treated at the clinic.

## 2018-08-16 NOTE — ED Notes (Signed)
The pt ran out of his nebulizer meds yesterday

## 2018-08-26 ENCOUNTER — Encounter (HOSPITAL_COMMUNITY): Payer: Self-pay | Admitting: *Deleted

## 2018-08-26 ENCOUNTER — Emergency Department (HOSPITAL_COMMUNITY)
Admission: EM | Admit: 2018-08-26 | Discharge: 2018-08-26 | Disposition: A | Discharge status: Home / Self Care | Payer: Self-pay | Attending: Emergency Medicine | Admitting: Emergency Medicine

## 2018-08-26 ENCOUNTER — Other Ambulatory Visit: Payer: Self-pay

## 2018-08-26 DIAGNOSIS — Z79899 Other long term (current) drug therapy: Secondary | ICD-10-CM | POA: Insufficient documentation

## 2018-08-26 DIAGNOSIS — R112 Nausea with vomiting, unspecified: Secondary | ICD-10-CM | POA: Insufficient documentation

## 2018-08-26 DIAGNOSIS — R1013 Epigastric pain: Secondary | ICD-10-CM | POA: Insufficient documentation

## 2018-08-26 DIAGNOSIS — J45909 Unspecified asthma, uncomplicated: Secondary | ICD-10-CM | POA: Insufficient documentation

## 2018-08-26 DIAGNOSIS — R197 Diarrhea, unspecified: Secondary | ICD-10-CM | POA: Insufficient documentation

## 2018-08-26 LAB — CBC
HCT: 44.4 % (ref 39.0–52.0)
Hemoglobin: 16.1 g/dL (ref 13.0–17.0)
MCH: 33.3 pg (ref 26.0–34.0)
MCHC: 36.3 g/dL — ABNORMAL HIGH (ref 30.0–36.0)
MCV: 91.9 fL (ref 80.0–100.0)
Platelets: 238 10*3/uL (ref 150–400)
RBC: 4.83 MIL/uL (ref 4.22–5.81)
RDW: 11.8 % (ref 11.5–15.5)
WBC: 4.5 10*3/uL (ref 4.0–10.5)
nRBC: 0 % (ref 0.0–0.2)

## 2018-08-26 LAB — COMPREHENSIVE METABOLIC PANEL
ALT: 62 U/L — ABNORMAL HIGH (ref 0–44)
AST: 36 U/L (ref 15–41)
Albumin: 4.2 g/dL (ref 3.5–5.0)
Alkaline Phosphatase: 81 U/L (ref 38–126)
Anion gap: 8 (ref 5–15)
BUN: 9 mg/dL (ref 6–20)
CO2: 27 mmol/L (ref 22–32)
Calcium: 9 mg/dL (ref 8.9–10.3)
Chloride: 103 mmol/L (ref 98–111)
Creatinine, Ser: 1.25 mg/dL — ABNORMAL HIGH (ref 0.61–1.24)
GFR calc Af Amer: >60 mL/min (ref >60)
GFR calc non Af Amer: >60 mL/min (ref >60)
Glucose, Bld: 87 mg/dL (ref 70–99)
Potassium: 4.2 mmol/L (ref 3.5–5.1)
Sodium: 138 mmol/L (ref 135–145)
Total Bilirubin: 0.8 mg/dL (ref 0.3–1.2)
Total Protein: 8.3 g/dL — ABNORMAL HIGH (ref 6.5–8.1)

## 2018-08-26 LAB — URINALYSIS, ROUTINE W REFLEX MICROSCOPIC
Bilirubin Urine: NEGATIVE
Glucose, UA: NEGATIVE mg/dL
Hgb urine dipstick: NEGATIVE
Ketones, ur: NEGATIVE mg/dL
Leukocytes,Ua: NEGATIVE
Nitrite: NEGATIVE
Protein, ur: NEGATIVE mg/dL
Specific Gravity, Urine: 1.008 (ref 1.005–1.030)
pH: 7 (ref 5.0–8.0)

## 2018-08-26 LAB — LIPASE, BLOOD: Lipase: 43 U/L (ref 11–51)

## 2018-08-26 MED ORDER — PREDNISONE 20 MG PO TABS: 60.0000 mg | ORAL_TABLET | Freq: Every day | ORAL | 0 refills | Status: DC

## 2018-08-26 MED ORDER — ONDANSETRON HCL 4 MG PO TABS: 4.0000 mg | ORAL_TABLET | Freq: Four times a day (QID) | ORAL | 0 refills | Status: DC

## 2018-08-26 MED ORDER — ALUM & MAG HYDROXIDE-SIMETH 200-200-20 MG/5ML PO SUSP
30.0000 mL | Freq: Once | ORAL | Status: AC
  Administered 2018-08-26: 30 mL via ORAL
  Filled 2018-08-26: qty 30

## 2018-08-26 MED ORDER — ONDANSETRON HCL 4 MG/2ML IJ SOLN
4.0000 mg | Freq: Once | INTRAMUSCULAR | Status: AC
  Administered 2018-08-26: 19:00:00 4 mg via INTRAVENOUS
  Filled 2018-08-26: qty 2

## 2018-08-26 MED ORDER — SODIUM CHLORIDE 0.9% FLUSH: 3.0000 mL | Freq: Once | INTRAVENOUS | Status: DC

## 2018-08-26 MED ORDER — ONDANSETRON HCL 4 MG/2ML IJ SOLN
4.0000 mg | Freq: Once | INTRAMUSCULAR | Status: AC
  Administered 2018-08-26: 21:00:00 4 mg via INTRAVENOUS
  Filled 2018-08-26: qty 2

## 2018-08-26 MED ORDER — SODIUM CHLORIDE 0.9 % IV BOLUS
1000.0000 mL | Freq: Once | INTRAVENOUS | Status: AC
  Administered 2018-08-26: 1000 mL via INTRAVENOUS

## 2018-08-26 NOTE — ED Provider Notes (Signed)
[facility]-EMERGENCY DEPT Provider Note   CSN: [id number] Arrival date & time: 08/26/18  1803    History   Chief Complaint Chief Complaint  Patient presents with  . Emesis  . Nausea    HPI Dustin Christian is a 23 y.o. male.     The history is provided by the patient and medical records. No language interpreter was used.  Emesis     23 year old male with history of asthma presents with complaints of nausea and vomiting.  Patient report for the past 3 days he has had persistent nausea, occasional nonbloody nonbilious vomiting after eating, and mild epigastric tenderness.  States he vomited 4 times yesterday and once this morning.  He also report having an asthma exacerbation a week ago and was seen in the ED for that and received treatment and that has since improved.  He does not complain of any fever or chills, no dysuria, no diarrhea or constipation.  Denies any recent sick contact or any recent travel.  Has not been exposed to anyone with COVID-19.  No history of alcohol abuse.  Symptom is mild to moderate at this time.  Past Medical History:  Diagnosis Date  . Asthma   . Seasonal allergies     There are no active problems to display for this patient.   History reviewed. No pertinent surgical history.      Home Medications    Prior to Admission medications   Medication Sig Start Date End Date Taking? Authorizing Provider  albuterol (VENTOLIN HFA) 108 (90 Base) MCG/ACT inhaler Inhale 2 puffs into the lungs every 6 (six) hours as needed for wheezing or shortness of breath. 08/16/18   Ardyth HarpsHernandez, Ana P, PA-C  diphenhydrAMINE (BENADRYL) 25 MG tablet Take 50 mg by mouth every 6 (six) hours as needed for itching or allergies.    [provider]  predniSONE (DELTASONE) 20 MG tablet Take 3 tablets (60 mg total) by mouth daily. 08/11/16   Ward, Chase PicketJaime Pilcher, PA-C    Family History No family history on file.  Social History Social History    Tobacco Use  . Smoking status: Never Smoker  . Smokeless tobacco: Never Used  Substance Use Topics  . Alcohol use: No  . Drug use: No     Allergies   Patient has no known allergies.   Review of Systems Review of Systems  Gastrointestinal: Positive for vomiting.  All other systems reviewed and are negative.    Physical Exam Updated Vital Signs BP 123/85 (BP Location: Left Arm)   Pulse 82   Temp 98 F (36.7 C) (Oral)   Resp 18   Ht 5\' 11"  (1.803 m)   Wt 89.8 kg   SpO2 97%   BMI 27.62 kg/m   Physical Exam Vitals signs and nursing note reviewed.  Constitutional:      General: He is not in acute distress.    Appearance: He is well-developed.  HENT:     Head: Atraumatic.     Mouth/Throat:     Mouth: Mucous membranes are moist.  Eyes:     Conjunctiva/sclera: Conjunctivae normal.  Neck:     Musculoskeletal: Neck supple.  Cardiovascular:     Rate and Rhythm: Normal rate and regular rhythm.     Pulses: Normal pulses.     Heart sounds: Normal heart sounds.  Pulmonary:     Effort: Pulmonary effort is normal.     Breath sounds: Normal breath sounds.  Abdominal:  General: Abdomen is flat. Bowel sounds are normal.     Palpations: Abdomen is soft.     Tenderness: There is no abdominal tenderness.  Skin:    General: Skin is warm.     Capillary Refill: Capillary refill takes less than 2 seconds.     Findings: No rash.  Neurological:     Mental Status: He is alert and oriented to person, place, and time.  Psychiatric:        Mood and Affect: Mood normal.      ED Treatments / Results  Labs (all labs ordered are listed, but only abnormal results are displayed) Labs Reviewed  COMPREHENSIVE METABOLIC PANEL - Abnormal; Notable for the following components:      Result Value   Creatinine, Ser 1.25 (*)    Total Protein 8.3 (*)    ALT 62 (*)    All other components within normal limits  CBC - Abnormal; Notable for the following components:   MCHC 36.3 (*)     All other components within normal limits  LIPASE, BLOOD  URINALYSIS, ROUTINE W REFLEX MICROSCOPIC    EKG None  Radiology No results found.  Procedures Procedures (including critical care time)  Medications Ordered in ED Medications  sodium chloride flush (NS) 0.9 % injection 3 mL (has no administration in time range)     Initial Impression / Assessment and Plan / ED Course  I have reviewed the triage vital signs and the nursing notes.  Pertinent labs & imaging results that were available during my care of the patient were reviewed by me and considered in my medical decision making (see chart for details).        BP 121/72 (BP Location: Left Arm)   Pulse 72   Temp 98 F (36.7 C) (Oral)   Resp 15   Ht  (1.803 m)   Wt 89.8 kg   SpO2 98%   BMI 27.62 kg/m    Final Clinical Impressions(s) / ED Diagnoses   Final diagnoses:  Nausea vomiting and diarrhea    ED Discharge Orders         Ordered    predniSONE (DELTASONE) 20 MG tablet  Daily     08/26/18 2313    ondansetron (ZOFRAN) 4 MG tablet  Every 6 hours     08/26/18 2313         6:39 PM Patient complains of nausea and vomiting after eating.  Symptom has been ongoing for the past 2 to 3 days.  He does not have any significant abdominal discomfort on exam to suggest acute biliary pathology.  Lungs are clear to auscultation.  Does not have any symptoms to suggest COVID-19.  Will perform screening labs, give IV fluid and antinausea medication.  9:12 PM Labs are reassuring, mild AKI with creatinine of 1.25.  Patient received IV fluid here. He is still endorsing nausea after  of zofran.  Will continue with symptom treatment, GI cocktail given.   Dustin Christian was evaluated in Emergency Department on 08/26/2018 for the symptoms described in the history of present illness. He was evaluated in the context of the global COVID-19 pandemic, which necessitated consideration that the patient might be at risk for  infection with the SARS-CoV-2 virus that causes COVID-19. Institutional protocols and algorithms that pertain to the evaluation of patients at risk for COVID-19 are in a state of rapid change based on information released by regulatory bodies including the CDC and federal and state organizations. These policies  and algorithms were followed during the patient's care in the ED.    Fayrene Helper, PA-C 08/26/18 2350    Tegeler, Canary Brim, MD 08/27/18 [phone]

## 2018-08-26 NOTE — ED Notes (Signed)
Provided pt with a urinal for a specimen

## 2018-08-26 NOTE — ED Triage Notes (Signed)
Nausea and vomiting x 3 days. Father called pt and told him to get tested for Covid since he is admitted here, states he went to [facility] last week for cough and blood work, rambles in [employer], sport and exercise.

## 2018-10-20 ENCOUNTER — Other Ambulatory Visit: Payer: Self-pay

## 2018-10-20 ENCOUNTER — Emergency Department (HOSPITAL_COMMUNITY): Payer: Self-pay

## 2018-10-20 ENCOUNTER — Encounter (HOSPITAL_COMMUNITY): Payer: Self-pay | Admitting: Obstetrics and Gynecology

## 2018-10-20 ENCOUNTER — Emergency Department (HOSPITAL_COMMUNITY)
Admission: EM | Admit: 2018-10-20 | Discharge: 2018-10-20 | Disposition: A | Discharge status: Home / Self Care | Payer: Self-pay | Attending: Emergency Medicine | Admitting: Emergency Medicine

## 2018-10-20 DIAGNOSIS — R109 Unspecified abdominal pain: Secondary | ICD-10-CM

## 2018-10-20 DIAGNOSIS — J45909 Unspecified asthma, uncomplicated: Secondary | ICD-10-CM | POA: Insufficient documentation

## 2018-10-20 DIAGNOSIS — R1012 Left upper quadrant pain: Secondary | ICD-10-CM | POA: Insufficient documentation

## 2018-10-20 DIAGNOSIS — R1011 Right upper quadrant pain: Secondary | ICD-10-CM | POA: Insufficient documentation

## 2018-10-20 DIAGNOSIS — R1013 Epigastric pain: Secondary | ICD-10-CM | POA: Insufficient documentation

## 2018-10-20 LAB — CBC WITH DIFFERENTIAL/PLATELET
Abs Immature Granulocytes: 0.01 10*3/uL (ref 0.00–0.07)
Basophils Absolute: 0 10*3/uL (ref 0.0–0.1)
Basophils Relative: 1 %
Eosinophils Absolute: 0.2 10*3/uL (ref 0.0–0.5)
Eosinophils Relative: 4 %
HCT: 43.7 % (ref 39.0–52.0)
Hemoglobin: 15.9 g/dL (ref 13.0–17.0)
Immature Granulocytes: 0 %
Lymphocytes Relative: 41 %
Lymphs Abs: 1.6 10*3/uL (ref 0.7–4.0)
MCH: 32.6 pg (ref 26.0–34.0)
MCHC: 36.4 g/dL — ABNORMAL HIGH (ref 30.0–36.0)
MCV: 89.7 fL (ref 80.0–100.0)
Monocytes Absolute: 0.3 10*3/uL (ref 0.1–1.0)
Monocytes Relative: 9 %
Neutro Abs: 1.9 10*3/uL (ref 1.7–7.7)
Neutrophils Relative %: 45 %
Platelets: 259 10*3/uL (ref 150–400)
RBC: 4.87 MIL/uL (ref 4.22–5.81)
RDW: 11.2 % — ABNORMAL LOW (ref 11.5–15.5)
WBC: 4 10*3/uL (ref 4.0–10.5)
nRBC: 0 % (ref 0.0–0.2)

## 2018-10-20 LAB — LIPASE, BLOOD: Lipase: 32 U/L (ref 11–51)

## 2018-10-20 LAB — COMPREHENSIVE METABOLIC PANEL
ALT: 27 U/L (ref 0–44)
AST: 19 U/L (ref 15–41)
Albumin: 4 g/dL (ref 3.5–5.0)
Alkaline Phosphatase: 73 U/L (ref 38–126)
Anion gap: 6 (ref 5–15)
BUN: 9 mg/dL (ref 6–20)
CO2: 26 mmol/L (ref 22–32)
Calcium: 8.8 mg/dL — ABNORMAL LOW (ref 8.9–10.3)
Chloride: 105 mmol/L (ref 98–111)
Creatinine, Ser: 1.07 mg/dL (ref 0.61–1.24)
GFR calc Af Amer: >60 mL/min (ref >60)
GFR calc non Af Amer: >60 mL/min (ref >60)
Glucose, Bld: 84 mg/dL (ref 70–99)
Potassium: 3.5 mmol/L (ref 3.5–5.1)
Sodium: 137 mmol/L (ref 135–145)
Total Bilirubin: 0.9 mg/dL (ref 0.3–1.2)
Total Protein: 8 g/dL (ref 6.5–8.1)

## 2018-10-20 MED ORDER — PROCHLORPERAZINE EDISYLATE 10 MG/2ML IJ SOLN
10.0000 mg | Freq: Once | INTRAMUSCULAR | Status: AC
  Administered 2018-10-20: 22:00:00 10 mg via INTRAVENOUS
  Filled 2018-10-20: qty 2

## 2018-10-20 MED ORDER — SODIUM CHLORIDE (PF) 0.9 % IJ SOLN
INTRAMUSCULAR | Status: AC
  Administered 2018-10-20: 21:00:00
  Filled 2018-10-20: qty 50

## 2018-10-20 MED ORDER — FENTANYL CITRATE (PF) 100 MCG/2ML IJ SOLN
50.0000 ug | Freq: Once | INTRAMUSCULAR | Status: AC
  Administered 2018-10-20: 21:00:00 50 ug via INTRAVENOUS
  Filled 2018-10-20: qty 2

## 2018-10-20 MED ORDER — PANTOPRAZOLE SODIUM 40 MG IV SOLR
40.0000 mg | Freq: Once | INTRAVENOUS | Status: AC
  Administered 2018-10-20: 40 mg via INTRAVENOUS
  Filled 2018-10-20: qty 40

## 2018-10-20 MED ORDER — SODIUM CHLORIDE 0.9 % IV BOLUS
1000.0000 mL | Freq: Once | INTRAVENOUS | Status: AC
  Administered 2018-10-20: 1000 mL via INTRAVENOUS

## 2018-10-20 MED ORDER — ONDANSETRON HCL 4 MG PO TABS: 4.0000 mg | ORAL_TABLET | Freq: Four times a day (QID) | ORAL | 0 refills | Status: AC

## 2018-10-20 MED ORDER — PANTOPRAZOLE SODIUM 20 MG PO TBEC: 20.0000 mg | DELAYED_RELEASE_TABLET | Freq: Every day | ORAL | 0 refills | Status: DC

## 2018-10-20 MED ORDER — ONDANSETRON HCL 4 MG/2ML IJ SOLN
4.0000 mg | Freq: Once | INTRAMUSCULAR | Status: AC
  Administered 2018-10-20: 4 mg via INTRAVENOUS
  Filled 2018-10-20: qty 2

## 2018-10-20 MED ORDER — IOHEXOL 300 MG/ML  SOLN
100.0000 mL | Freq: Once | INTRAMUSCULAR | Status: AC | PRN
  Administered 2018-10-20: 100 mL via INTRAVENOUS

## 2018-10-20 NOTE — ED Provider Notes (Signed)
[facility]-EMERGENCY DEPT Provider Note   CSN: [id number] Arrival date & time: 10/20/18  1933    History   Chief Complaint Chief Complaint  Patient presents with   Abdominal Pain    HPI Dustin Christian is a 23 y.o. male.     The history is provided by the patient.  Abdominal Pain Pain location:  Epigastric, RUQ and LUQ Pain quality: aching, cramping and dull   Pain severity:  Mild Onset quality:  Gradual Timing:  Constant Progression:  Worsening Chronicity:  New Context: alcohol use   Relieved by:  Nothing Worsened by:  Eating Associated symptoms: nausea   Associated symptoms: no chest pain, no chills, no cough, no diarrhea, no dysuria, no fever, no hematuria, no shortness of breath, no sore throat and no vomiting     Past Medical History:  Diagnosis Date   Asthma    Seasonal allergies     There are no active problems to display for this patient.   History reviewed. No pertinent surgical history.      Home Medications    Prior to Admission medications   Medication Sig Start Date End Date Taking? Authorizing Provider  albuterol (VENTOLIN HFA) 108 (90 Base) MCG/ACT inhaler Inhale 2 puffs into the lungs every 6 (six) hours as needed for wheezing or shortness of breath. 08/16/18  Yes Hernandez, Ana P, PA-C  ondansetron (ZOFRAN) 4 MG tablet Take 1 tablet (4 mg total) by mouth every 6 (six) hours for 15 doses. 10/20/18 10/24/18  Sumaya Riedesel, DO  pantoprazole (PROTONIX) 20 MG tablet Take 1 tablet (20 mg total) by mouth daily. 10/20/18 11/19/18  Raelyn Racette, DO  predniSONE (DELTASONE) 20 MG tablet Take 3 tablets (60 mg total) by mouth daily. Patient not taking: Reported on 10/20/2018 08/26/18   Fayrene Helperran, [place], PA-C    Family History No family history on file.  Social History Social History   Tobacco Use   Smoking status: Never Smoker   Smokeless tobacco: Never Used  Substance Use Topics   Alcohol use: No   Drug use: No     Allergies     Patient has no known allergies.   Review of Systems Review of Systems  Constitutional: Negative for chills and fever.  HENT: Negative for ear pain and sore throat.   Eyes: Negative for pain and visual disturbance.  Respiratory: Negative for cough and shortness of breath.   Cardiovascular: Negative for chest pain and palpitations.  Gastrointestinal: Positive for abdominal pain and nausea. Negative for diarrhea and vomiting.  Genitourinary: Negative for dysuria and hematuria.  Musculoskeletal: Negative for arthralgias and back pain.  Skin: Negative for color change and rash.  Neurological: Negative for seizures and syncope.  All other systems reviewed and are negative.    Physical Exam Updated Vital Signs  ED Triage Vitals  Enc Vitals Group     BP 10/20/18 1937 135/73     Pulse Rate 10/20/18 1937 80     Resp 10/20/18 1937 18     Temp 10/20/18 1937 98.5 F (36.9 C)     Temp Source 10/20/18 1937 Oral     SpO2 10/20/18 1937 95 %     Weight --      Height --      Head Circumference --      Peak Flow --      Pain Score 10/20/18 1940 10     Pain Loc --      Pain Edu? --  Excl. in GC? --     Physical Exam Vitals signs and nursing note reviewed.  Constitutional:      General: He is not in acute distress.    Appearance: He is well-developed. He is not ill-appearing.  HENT:     Head: Normocephalic and atraumatic.  Eyes:     Conjunctiva/sclera: Conjunctivae normal.  Neck:     Musculoskeletal: Neck supple.  Cardiovascular:     Rate and Rhythm: Normal rate and regular rhythm.     Heart sounds: Normal heart sounds. No murmur.  Pulmonary:     Effort: Pulmonary effort is normal. No respiratory distress.     Breath sounds: Normal breath sounds.  Abdominal:     General: There is distension.     Palpations: Abdomen is soft.     Tenderness: There is abdominal tenderness in the right upper quadrant, epigastric area and left upper quadrant. There is guarding.  Skin:     General: Skin is warm and dry.  Neurological:     General: No focal deficit present.     Mental Status: He is alert.  Psychiatric:        Mood and Affect: Mood normal.      ED Treatments / Results  Labs (all labs ordered are listed, but only abnormal results are displayed) Labs Reviewed  CBC WITH DIFFERENTIAL/PLATELET - Abnormal; Notable for the following components:      Result Value   MCHC 36.4 (*)    RDW 11.2 (*)    All other components within normal limits  COMPREHENSIVE METABOLIC PANEL - Abnormal; Notable for the following components:   Calcium 8.8 (*)    All other components within normal limits  LIPASE, BLOOD    EKG None  Radiology Ct Abdomen Pelvis W Contrast  Result Date: 10/20/2018 CLINICAL DATA:  Nausea and vomiting. Abdominal pain. Patient reports several months of right upper quadrant pain. EXAM: CT ABDOMEN AND PELVIS WITH CONTRAST TECHNIQUE: Multidetector CT imaging of the abdomen and pelvis was performed using the standard protocol following bolus administration of intravenous contrast. CONTRAST:  100mL OMNIPAQUE IOHEXOL 300 MG/ML  SOLN COMPARISON:  None. FINDINGS: Lower chest: Mild heterogeneity in the dependent lung bases likely hypoventilatory atelectasis. Hepatobiliary: No focal liver abnormality is seen. No gallstones, gallbladder wall thickening, or biliary dilatation. Pancreas: No ductal dilatation or inflammation. Spleen: Normal in size without focal abnormality. Adrenals/Urinary Tract: Normal adrenal glands. 3 mm nonobstructing stone in the upper right kidney. No hydronephrosis or perinephric edema. No ureteral calculi. Urinary bladder is physiologically distended. No bladder wall thickening. Stomach/Bowel: Stomach is within normal limits. Appendix appears normal. No evidence of bowel wall thickening, distention, or inflammatory changes. Vascular/Lymphatic: No significant vascular findings are present. No enlarged abdominal or pelvic lymph nodes. Reproductive:  Prostate is unremarkable. Other: No free air, free fluid, or intra-abdominal fluid collection. Musculoskeletal: There are no acute or suspicious osseous abnormalities. IMPRESSION: 1. No acute abnormality. 2. Nonobstructing right nephrolithiasis. Electronically Signed   By: Narda RutherfordMelanie  Sanford M.D.   On: 10/20/2018 22:06    Procedures Procedures (including critical care time)  Medications Ordered in ED Medications  fentaNYL (SUBLIMAZE) injection 50 mcg (50 mcg Intravenous Given 10/20/18 2030)  sodium chloride 0.9 % bolus 1,000 mL (0 mLs Intravenous Stopped 10/20/18 2136)  ondansetron (ZOFRAN) injection 4 mg (4 mg Intravenous Given 10/20/18 2030)  iohexol (OMNIPAQUE) 300 MG/ML solution 100 mL (100 mLs Intravenous Contrast Given 10/20/18 2123)  sodium chloride (PF) 0.9 % injection (  Given by Other 10/20/18  2123)  pantoprazole (PROTONIX) injection 40 mg (40 mg Intravenous Given 10/20/18 2203)  prochlorperazine (COMPAZINE) injection 10 mg (10 mg Intravenous Given 10/20/18 2203)     Initial Impression / Assessment and Plan / ED Course  I have reviewed the triage vital signs and the nursing notes.  Pertinent labs & imaging results that were available during my care of the patient were reviewed by me and considered in my medical decision making (see chart for details).     Joenathan Sakuma is a 23 year old male with no significant medical history who presents to the ED with upper abdominal pain.  Pain started 2 days ago after binge drinking alcohol.  Worse with eating food.  Pain in the epigastric and right upper quadrant on exam.  Patient is distended.  Denies any history of pancreatitis.  Possibly some reflux symptoms in the past.  No history of abdominal surgery.  Patient with tenderness in the upper abdomen on exam.  Will get a CT scan of the abdomen.  Will give IV fluids, IV fentanyl, IV Zofran.  Will get lab work.  Concern for pancreatitis versus gastritis versus perforation.  Possibly gallbladder related however  will start with a CT scan given that patient has diffuse pain.  Lab work with no significant anemia, electrolyte abnormality, kidney injury.  Lipase normal.  Gallbladder and liver enzymes within normal limits.  Patient given additional IV Compazine and IV Protonix.  Awaiting CT scan.  CT scan overall unremarkable.  No pancreatitis.  No gallstones.  Patient has 3 mm stone within the upper right kidney, nonobstructing.  Possibly the cause of the pain.  Will prescribe omeprazole for possible gastritis, Zofran for nausea.  Recommend continued use of Tylenol Motrin.  Given return precautions and discharged in the ED in good condition.  This chart was dictated using voice recognition software.  Despite best efforts to proofread,  errors can occur which can change the documentation meaning.    Final Clinical Impressions(s) / ED Diagnoses   Final diagnoses:  Abdominal pain, unspecified abdominal location    ED Discharge Orders         Ordered    ondansetron (ZOFRAN) 4 MG tablet  Every 6 hours     10/20/18 2218    pantoprazole (PROTONIX) 20 MG tablet  Daily     10/20/18 2218           Lennice Sites, DO 10/20/18 2218

## 2018-10-20 NOTE — ED Triage Notes (Signed)
Patient reports he believes he has a gallstone. Pt reports RUQ pain with N/V. Pt denies diarrhea. Pt reports yellow/bile tinged emesis today.  When RN asked why patient believed it to be his gallbladderl, pt reported "I know the symptoms." PT has not hx of gallstones.

## 2018-10-20 NOTE — Discharge Instructions (Addendum)
we will treat you for nausea with Zofran.  Will give Protonix for possible stomach inflammation.

## 2018-10-20 NOTE — ED Notes (Signed)
Patient transported to CT 

## 2018-10-20 NOTE — ED Notes (Signed)
Patient made aware that urine sample is needed. Will continue to monitor patient. 

## 2018-10-20 NOTE — ED Notes (Signed)
Patient vomited while in CT and endorses nausea currently. EDP made aware.

## 2019-05-07 ENCOUNTER — Other Ambulatory Visit: Payer: Self-pay

## 2019-05-07 ENCOUNTER — Ambulatory Visit (HOSPITAL_COMMUNITY)
Admission: EM | Admit: 2019-05-07 | Discharge: 2019-05-07 | Disposition: A | Discharge status: Home / Self Care | Payer: Medicaid Other | Attending: Family Medicine | Admitting: Family Medicine

## 2019-05-07 ENCOUNTER — Encounter (HOSPITAL_COMMUNITY): Payer: Self-pay | Admitting: Emergency Medicine

## 2019-05-07 DIAGNOSIS — Z113 Encounter for screening for infections with a predominantly sexual mode of transmission: Secondary | ICD-10-CM | POA: Insufficient documentation

## 2019-05-07 LAB — HIV ANTIBODY (ROUTINE TESTING W REFLEX): HIV Screen 4th Generation wRfx: NONREACTIVE

## 2019-05-07 NOTE — ED Triage Notes (Signed)
Pt here for STD testing.  He is entering a new relationship and wants to be sure he is clear.  He wants blood work as well as the swab.

## 2019-05-07 NOTE — ED Provider Notes (Signed)
[facility]   [id number] 05/07/19 Arrival Time: 0911  ASSESSMENT & PLAN:  1. Screening for STDs (sexually transmitted diseases)       Discharge Instructions     We have sent testing for sexually transmitted infections. We will notify you of any positive results once they are received. If required, we will prescribe any medications you might need.  Please refrain from all sexual activity for at least the next seven days.     Pending: Labs Reviewed  RPR  HIV ANTIBODY (ROUTINE TESTING W REFLEX)  CYTOLOGY, (ORAL, ANAL, URETHRAL) ANCILLARY ONLY    Will notify of any positive results. Instructed to refrain from sexual activity for at least seven days.  Reviewed expectations re: course of current medical issues. Questions answered. Outlined signs and symptoms indicating need for more acute intervention. Patient verbalized understanding. After Visit Summary given.   SUBJECTIVE:  Dustin Christian is a 24 y.o. male who requests STD testing. New relationship. Without symptoms or reported h/o STD. Feeling well. Sexually active; one partner.  ROS: As per HPI. All other systems negative.   OBJECTIVE:  Vitals:   05/07/19 0938  BP: (!) 135/92  Pulse: (!) 104  Resp: 18  Temp: 98.4 F (36.9 C)  TempSrc: Oral  SpO2: 98%     General appearance: alert, cooperative, appears stated age and no distress Throat: lips, mucosa, and tongue normal; teeth and gums normal GU: normal appearing genitalia Skin: warm and dry Psychological: alert and cooperative; normal mood and affect.    Labs Reviewed  RPR  HIV ANTIBODY (ROUTINE TESTING W REFLEX)  CYTOLOGY, (ORAL, ANAL, URETHRAL) ANCILLARY ONLY    No Known Allergies  Past Medical History:  Diagnosis Date  . Asthma   . Seasonal allergies    Family History  Problem Relation Age of Onset  . Healthy Mother   . Diabetes Father   . Asthma Father   . Hypertension Father    Social History   Socioeconomic History  .  Marital status: Single    Spouse name: Not on file  . Number of children: Not on file  . Years of education: Not on file  . Highest education level: Not on file  Occupational History  . Not on file  Tobacco Use  . Smoking status: Never Smoker  . Smokeless tobacco: Never Used  Substance and Sexual Activity  . Alcohol use: No  . Drug use: No  . Sexual activity: Yes  Other Topics Concern  . Not on file  Social History Narrative  . Not on file   Social Determinants of Health   Financial Resource Strain:   . Difficulty of Paying Living Expenses: Not on file  Food Insecurity:   . Worried About Charity fundraiser in the Last Year: Not on file  . Ran Out of Food in the Last Year: Not on file  Transportation Needs:   . Lack of Transportation (Medical): Not on file  . Lack of Transportation (Non-Medical): Not on file  Physical Activity:   . Days of Exercise per Week: Not on file  . Minutes of Exercise per Session: Not on file  Stress:   . Feeling of Stress : Not on file  Social Connections:   . Frequency of Communication with Friends and Family: Not on file  . Frequency of Social Gatherings with Friends and Family: Not on file  . Attends Religious Services: Not on file  . Active Member of Clubs or Organizations: Not on file  .  Attends Banker Meetings: Not on file  . Marital Status: Not on file  Intimate Partner Violence:   . Fear of Current or Ex-Partner: Not on file  . Emotionally Abused: Not on file  . Physically Abused: Not on file  . Sexually Abused: Not on file          Mardella Layman, MD 05/07/19 [phone]

## 2019-05-07 NOTE — Discharge Instructions (Addendum)
We have sent testing for sexually transmitted infections. We will notify you of any positive results once they are received. If required, we will prescribe any medications you might need.  Please refrain from all sexual activity for at least the next seven days.  

## 2019-05-08 LAB — CYTOLOGY, (ORAL, ANAL, URETHRAL) ANCILLARY ONLY
Chlamydia: NEGATIVE
Neisseria Gonorrhea: NEGATIVE
Trichomonas: NEGATIVE

## 2019-05-08 LAB — RPR: RPR Ser Ql: NONREACTIVE

## 2019-08-01 ENCOUNTER — Emergency Department (HOSPITAL_COMMUNITY): Payer: Medicaid Other

## 2019-08-01 ENCOUNTER — Emergency Department (HOSPITAL_COMMUNITY)
Admission: EM | Admit: 2019-08-01 | Discharge: 2019-08-01 | Disposition: A | Discharge status: Home / Self Care | Payer: Medicaid Other | Attending: Emergency Medicine | Admitting: Emergency Medicine

## 2019-08-01 ENCOUNTER — Encounter (HOSPITAL_COMMUNITY): Payer: Self-pay

## 2019-08-01 ENCOUNTER — Other Ambulatory Visit: Payer: Self-pay

## 2019-08-01 DIAGNOSIS — J45901 Unspecified asthma with (acute) exacerbation: Secondary | ICD-10-CM | POA: Insufficient documentation

## 2019-08-01 MED ORDER — ALBUTEROL SULFATE HFA 108 (90 BASE) MCG/ACT IN AERS
2.0000 | INHALATION_SPRAY | RESPIRATORY_TRACT | Status: DC | PRN
  Administered 2019-08-01: 11:00:00 2 via RESPIRATORY_TRACT
  Filled 2019-08-01: qty 6.7

## 2019-08-01 MED ORDER — AEROCHAMBER PLUS FLO-VU LARGE MISC
1.0000 | Freq: Once | Status: DC
  Filled 2019-08-01: qty 1

## 2019-08-01 MED ORDER — PREDNISONE 10 MG PO TABS: 20.0000 mg | ORAL_TABLET | Freq: Two times a day (BID) | ORAL | 0 refills | Status: DC

## 2019-08-01 MED ORDER — AEROCHAMBER PLUS FLO-VU LARGE MISC
Status: AC
  Filled 2019-08-01: qty 1

## 2019-08-01 MED ORDER — PREDNISONE 20 MG PO TABS
60.0000 mg | ORAL_TABLET | Freq: Once | ORAL | Status: AC
  Administered 2019-08-01: 60 mg via ORAL
  Filled 2019-08-01: qty 3

## 2019-08-01 NOTE — Discharge Instructions (Addendum)
Please use inhaler every 4 hours.  Take prednisone as prescribed.  Return the emergency department if you are having worsening shortness of breath, fever, or chills

## 2019-08-01 NOTE — ED Notes (Addendum)
Pt states he has been having an acute asthma excerebration since last night, he states he does not currently have his rescue inhaler but his dad gave him an Primatene which he has used once. Pt has wheezing worse on right with coarse cough.

## 2019-08-01 NOTE — ED Triage Notes (Signed)
Pt reports asthma exacerbation and coughing since last night, using albuterol inhaler without relief. Pt coughing in triage, oxygen saturation at 93-94% on room air.

## 2019-08-01 NOTE — ED Notes (Signed)
ED Provider at bedside. 

## 2019-08-01 NOTE — ED Provider Notes (Signed)
[facility] EMERGENCY DEPARTMENT Provider Note   CSN: [id number] Arrival date & time: 08/01/19  4166     History Chief Complaint  Patient presents with  . Asthma  . Cough    Dustin Christian is a 24 y.o. male.  HPI    24 year old male history of seasonal allergies, asthma, presents today complaining of asthma exacerbation with symptoms that began yesterday.  He has been out of his albuterol inhaler for the past year.  Father gave him a Community education officer inhaler and he has been using this over the past 12 hours.  He continues to have dyspnea and wheezing.  He has had some nasal congestion consistent with his seasonal allergies.  He denies exposure to Covid, fever, or chills.  He has had some cough.  He is not a smoker. Past Medical History:  Diagnosis Date  . Asthma   . Seasonal allergies     There are no problems to display for this patient.   History reviewed. No pertinent surgical history.     Family History  Problem Relation Age of Onset  . Healthy Mother   . Diabetes Father   . Asthma Father   . Hypertension Father     Social History   Tobacco Use  . Smoking status: Never Smoker  . Smokeless tobacco: Never Used  Substance Use Topics  . Alcohol use: No  . Drug use: No    Home Medications Prior to Admission medications   Medication Sig Start Date End Date Taking? Authorizing Provider  albuterol (VENTOLIN HFA) 108 (90 Base) MCG/ACT inhaler Inhale 2 puffs into the lungs every 6 (six) hours as needed for wheezing or shortness of breath. 08/16/18   Jerilee Hoh, Ana P, PA-C  pantoprazole (PROTONIX) 20 MG tablet Take 1 tablet (20 mg total) by mouth daily. 10/20/18 11/19/18  Curatolo, Adam, DO  predniSONE (DELTASONE) 20 MG tablet Take 3 tablets (60 mg total) by mouth daily. Patient not taking: Reported on 10/20/2018 08/26/18   Domenic Moras, PA-C    Allergies    Patient has no known allergies.  Review of Systems   Review of Systems  All other systems  reviewed and are negative.   Physical Exam Updated Vital Signs BP 124/74   Pulse 96   Temp 98.7 F (37.1 C) (Oral)   Resp 18   Ht 1.778 m (5\' 10" )   Wt 89.9 kg   SpO2 93%   BMI 28.44 kg/m   Physical Exam Vitals and nursing note reviewed.  Constitutional:      Appearance: Normal appearance.  HENT:     Head: Normocephalic.     Right Ear: External ear normal.     Left Ear: External ear normal.     Nose: Nose normal.     Mouth/Throat:     Mouth: Mucous membranes are moist.  Eyes:     Extraocular Movements: Extraocular movements intact.     Pupils: Pupils are equal, round, and reactive to light.  Cardiovascular:     Rate and Rhythm: Normal rate and regular rhythm.  Pulmonary:     Effort: Pulmonary effort is normal.     Breath sounds: Wheezing present.  Abdominal:     General: Abdomen is flat.     Palpations: Abdomen is soft.  Musculoskeletal:        General: Normal range of motion.     Cervical back: Normal range of motion.  Skin:    General: Skin is warm.  Capillary Refill: Capillary refill takes less than 2 seconds.  Neurological:     General: No focal deficit present.     Mental Status: He is alert.  Psychiatric:        Mood and Affect: Mood normal.     ED Results / Procedures / Treatments   Labs (all labs ordered are listed, but only abnormal results are displayed) Labs Reviewed - No data to display  EKG None  Radiology DG Chest 2 View  Result Date: 08/01/2019 CLINICAL DATA:  Cough, asthma. EXAM: CHEST - 2 VIEW COMPARISON:  08/16/2018 FINDINGS: Cardiomediastinal contours and hilar structures are normal. Lungs are clear. No pleural effusion. Visualized skeletal structures are unremarkable. IMPRESSION: No active cardiopulmonary disease. Electronically Signed   By: Donzetta Kohut M.D.   On: 08/01/2019 10:30   I personally reviewed chest x-Tatiyana Foucher images and impression by radiologist Procedures Procedures (including critical care time)  Medications  Ordered in ED Medications  albuterol (VENTOLIN HFA) 108 (90 Base) MCG/ACT inhaler 2 puff (has no administration in time range)  AeroChamber Plus Flo-Vu Large MISC 1 each (has no administration in time range)  predniSONE (DELTASONE) tablet 60 mg (has no administration in time range)    ED Course  I have reviewed the triage vital signs and the nursing notes.  Pertinent labs & imaging results that were available during my care of the patient were reviewed by me and considered in my medical decision making (see chart for details).    MDM Rules/Calculators/A&P                      12:05 PM Rechecked and lungs clear. Patient will have albuterol inhaler that was given here.  Placed on prednisone for 5 days.  Discussed return precautions and need for him to find follow-up and he voices understanding. Final Clinical Impression(s) / ED Diagnoses Final diagnoses:  Mild asthma with exacerbation, unspecified whether persistent    Rx / DC Orders ED Discharge Orders    None       Margarita Grizzle, MD 08/01/19 1208

## 2019-09-10 ENCOUNTER — Encounter (HOSPITAL_COMMUNITY): Payer: Self-pay | Admitting: Emergency Medicine

## 2019-09-10 ENCOUNTER — Emergency Department (HOSPITAL_COMMUNITY)
Admission: EM | Admit: 2019-09-10 | Discharge: 2019-09-11 | Discharge status: Against Medical Advice | Payer: Self-pay | Attending: Emergency Medicine | Admitting: Emergency Medicine

## 2019-09-10 ENCOUNTER — Other Ambulatory Visit: Payer: Self-pay

## 2019-09-10 DIAGNOSIS — R519 Headache, unspecified: Secondary | ICD-10-CM | POA: Insufficient documentation

## 2019-09-10 DIAGNOSIS — Z5321 Procedure and treatment not carried out due to patient leaving prior to being seen by health care provider: Secondary | ICD-10-CM | POA: Insufficient documentation

## 2019-09-10 NOTE — ED Triage Notes (Signed)
Pt reports a migraine X5 days. Has been nauseas and throwning up, OTC medications not relieving the pain.  Negative for Neuro symptoms

## 2019-09-10 NOTE — ED Notes (Addendum)
encouraged pt to stay  Pt left

## 2019-09-23 ENCOUNTER — Encounter (HOSPITAL_COMMUNITY): Payer: Self-pay

## 2019-09-23 ENCOUNTER — Other Ambulatory Visit: Payer: Self-pay

## 2019-09-23 ENCOUNTER — Emergency Department (HOSPITAL_COMMUNITY)
Admission: EM | Admit: 2019-09-23 | Discharge: 2019-09-23 | Disposition: A | Discharge status: Home / Self Care | Payer: Medicaid Other | Attending: Emergency Medicine | Admitting: Emergency Medicine

## 2019-09-23 ENCOUNTER — Emergency Department (HOSPITAL_COMMUNITY): Payer: Medicaid Other

## 2019-09-23 DIAGNOSIS — Y929 Unspecified place or not applicable: Secondary | ICD-10-CM | POA: Insufficient documentation

## 2019-09-23 DIAGNOSIS — Y939 Activity, unspecified: Secondary | ICD-10-CM | POA: Insufficient documentation

## 2019-09-23 DIAGNOSIS — S46812A Strain of other muscles, fascia and tendons at shoulder and upper arm level, left arm, initial encounter: Secondary | ICD-10-CM

## 2019-09-23 DIAGNOSIS — X500XXA Overexertion from strenuous movement or load, initial encounter: Secondary | ICD-10-CM | POA: Insufficient documentation

## 2019-09-23 DIAGNOSIS — Y990 Civilian activity done for income or pay: Secondary | ICD-10-CM | POA: Insufficient documentation

## 2019-09-23 MED ORDER — LIDOCAINE 5 % EX PTCH
1.0000 | MEDICATED_PATCH | CUTANEOUS | Status: DC
  Administered 2019-09-23: 1 via TRANSDERMAL
  Filled 2019-09-23: qty 1

## 2019-09-23 MED ORDER — ACETAMINOPHEN 500 MG PO TABS
1000.0000 mg | ORAL_TABLET | Freq: Once | ORAL | Status: AC
  Administered 2019-09-23: 1000 mg via ORAL
  Filled 2019-09-23: qty 2

## 2019-09-23 MED ORDER — METHOCARBAMOL 500 MG PO TABS: 1000.0000 mg | ORAL_TABLET | Freq: Three times a day (TID) | ORAL | 0 refills | Status: AC

## 2019-09-23 MED ORDER — NAPROXEN 500 MG PO TABS: 500.0000 mg | ORAL_TABLET | Freq: Three times a day (TID) | ORAL | 0 refills | Status: AC

## 2019-09-23 MED ORDER — NAPROXEN 250 MG PO TABS
500.0000 mg | ORAL_TABLET | Freq: Once | ORAL | Status: AC
  Administered 2019-09-23: 500 mg via ORAL
  Filled 2019-09-23: qty 2

## 2019-09-23 MED ORDER — METHOCARBAMOL 500 MG PO TABS
1000.0000 mg | ORAL_TABLET | Freq: Once | ORAL | Status: AC
  Administered 2019-09-23: 1000 mg via ORAL
  Filled 2019-09-23: qty 2

## 2019-09-23 NOTE — ED Notes (Signed)
PT to radiology

## 2019-09-23 NOTE — ED Provider Notes (Signed)
[facility] EMERGENCY DEPARTMENT Provider Note   CSN: [id number] Arrival date & time: 09/23/19  1225     History No chief complaint on file.   Dustin Christian is a 24 y.o. male presents to ER for evaluation of left trapezius pain for last 2 weeks. 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 States he was lifting batteries up to chest level when he felt a pop and had pain in left trapezius. He has been using ibuprofen and icy hot with some relief.  His pain is worse when he lifts shoulder and arm above shoulder level and head, palpation. No direct trauma, falls. No distal tingling loss of sensation. No neck pain. No fevers. No chest pain   HPI     Past Medical History:  Diagnosis Date  . Asthma   . Seasonal allergies     There are no problems to display for this patient.   History reviewed. No pertinent surgical history.     Family History  Problem Relation Age of Onset  . Healthy Mother   . Diabetes Father   . Asthma Father   . Hypertension Father     Social History   Tobacco Use  . Smoking status: Never Smoker  . Smokeless tobacco: Never Used  Substance Use Topics  . Alcohol use: No  . Drug use: No    Home Medications Prior to Admission medications   Medication Sig Start Date End Date Taking? Authorizing Provider  albuterol (VENTOLIN HFA) 108 (90 Base) MCG/ACT inhaler Inhale 2 puffs into the lungs every 6 (six) hours as needed for wheezing or shortness of breath. Patient not taking: Reported on 08/01/2019 08/16/18   Darlin Drop P, PA-C  EPINEPHrine (PRIMATENE MIST) 0.125 MG/ACT AERO Inhale 2 puffs into the lungs every 6 (six) hours as needed (shortness of breath).    [provider]  methocarbamol (ROBAXIN) 500 MG tablet Take 2 tablets (1,000 mg total) by mouth 3 (three) times daily for 7 days. 09/23/19 09/30/19  Kinnie Feil, PA-C  naproxen (NAPROSYN) 500 MG tablet Take 1 tablet (500 mg total) by mouth 3 (three) times daily with meals for  7 days. 09/23/19 09/30/19  Kinnie Feil, PA-C  pantoprazole (PROTONIX) 20 MG tablet Take 1 tablet (20 mg total) by mouth daily. Patient not taking: Reported on 08/01/2019 10/20/18 11/19/18  Lennice Sites, DO  predniSONE (DELTASONE) 10 MG tablet Take 2 tablets (20 mg total) by mouth 2 (two) times daily with a meal. 08/01/19   Pattricia Boss, MD    Allergies    Patient has no known allergies.  Review of Systems   Review of Systems  Musculoskeletal: Positive for arthralgias and myalgias.  All other systems reviewed and are negative.   Physical Exam Updated Vital Signs BP 123/61 (BP Location: Right Arm)   Pulse 93   Temp 98.2 F (36.8 C) (Oral)   Resp 17   Ht 5\' 11"  (1.803 m)   Wt 90.7 kg   BMI 27.89 kg/m   Physical Exam Constitutional:      Appearance: He is well-developed.  HENT:     Head: Normocephalic.     Nose: Nose normal.  Eyes:     General: Lids are normal.  Neck:     Comments: With ROM of neck without pain.  Cardiovascular:     Rate and Rhythm: Normal rate.     Comments: 1+ radial pulses bilaterally  Pulmonary:     Effort: Pulmonary effort is normal. No  respiratory distress.  Musculoskeletal:        General: Tenderness present. Normal range of motion.     Cervical back: Normal range of motion.     Comments: C spine: no midline or paraspinal.  TTP right upper trapezius.  Patient refused to allow for passive ROM of shoulder above shoulder level states it is too painful.  No TTP clavicle, AC joint, [place] joint, scapula or anterior chest.  T spine: no midline or paraspinal tenderness.  No tenderness of left elbow, wrist.  Neurological:     Mental Status: He is alert.     Comments: Sensation and strength intact in LUE   Psychiatric:        Behavior: Behavior normal.     ED Results / Procedures / Treatments   Labs (all labs ordered are listed, but only abnormal results are displayed) Labs Reviewed - No data to display  EKG None  Radiology DG Shoulder  Left  Result Date: 09/23/2019 CLINICAL DATA:  Trapezius tenderness for 2 weeks. LEFT shoulder pain for 2 weeks after lifting something at work. EXAM: LEFT SHOULDER - 2+ VIEW COMPARISON:  08/16/2018 chest x-ray FINDINGS: There is no evidence of fracture or dislocation. There is no evidence of arthropathy or other focal bone abnormality. Soft tissues are unremarkable. IMPRESSION: Negative. Electronically Signed   By: Norva Pavlov M.D.   On: 09/23/2019 15:09    Procedures Procedures (including critical care time)  Medications Ordered in ED Medications  lidocaine (LIDODERM) 5 % 1 patch (1 patch Transdermal Patch Applied 09/23/19 1434)  acetaminophen (TYLENOL) tablet 1,000 mg (1,000 mg Oral Given 09/23/19 1433)  methocarbamol (ROBAXIN) tablet 1,000 mg (1,000 mg Oral Given 09/23/19 1433)  naproxen (NAPROSYN) tablet 500 mg (500 mg Oral Given 09/23/19 1433)    ED Course  I have reviewed the triage vital signs and the nursing notes.  Pertinent labs & imaging results that were available during my care of the patient were reviewed by me and considered in my medical decision making (see chart for details).    MDM Rules/Calculators/A&P                      Suspect trapezius muscular / soft tissue injury, strain, spasm. Clinically not consistent with cervical radiculopathy, he has no neck pain or pain with neck movements, paresthesias.   Patient felt better after medicines here.   Xray personally visualized and interpreted, non acute.   Will dc with symptomatic management.  Recommended ortho fu for full return to work. Return pracutions given. Patient in agreement.  Final Clinical Impression(s) / ED Diagnoses Final diagnoses:  Strain of left trapezius muscle, initial encounter    Rx / DC Orders ED Discharge Orders         Ordered    methocarbamol (ROBAXIN) 500 MG tablet  3 times daily     09/23/19 1544    naproxen (NAPROSYN) 500 MG tablet  3 times daily with meals     09/23/19 1544            Liberty Handy, PA-C 09/23/19 1544    Virgina Norfolk, DO 09/26/19 0809

## 2019-09-23 NOTE — ED Notes (Signed)
Patient verbalizes understanding of discharge instructions. Opportunity for questioning and answers were provided. Armband removed by staff. Patient discharged from ED. Signature pad unavailable.  

## 2019-09-23 NOTE — ED Triage Notes (Signed)
Patient complains of left shoulder pain x 2 weeks after lifting something at work, pain resolved and returned thursday

## 2019-09-23 NOTE — Discharge Instructions (Signed)
You were seen in the ER for evaluation of left trapezius pain  Xray is normal  I suspect your pain is from muscular strain and spasm  We will treat this with a combination of anti-inflammatories, muscle relaxers, stretches, rest. [phone] mg of acetaminophen (Tylenol) every 6 hours for the next 3-5 days 500 mg of naproxen every 8 hours for the next 3-5 days. Do not take naproxen if you are taking ibuprofen.  You can add acetaminophen and ibuprofen as above every 6-8 hours for maximum pain control. Do not exceed more than 4,000 mg acetaminophen 1000 mg methocarbamol every 8 hours for the next 3-5 days for associated spasms and tightness / 10 mg flexeril every 8 hours for the next 3-5 days for associated spasms and tightness Over-the-counter lidocaine-containing patches can be applied to the area of pain for 12 hours at a time Apply/rub diclofenac sodium gel on muscles until it feels dry every 6-8 hours, then can use heating pad for 20-30 mins to help absorb Rest for the next 48 hours to avoid further injury Transition back into daily activity slowly to avoid reinjury  Symptoms should improve over the next 48-72 hours and slowly resolve over a course of 7-10 days   Follow up with primary care doctor in 7-10 days if symptoms not improving

## 2020-03-30 NOTE — ED Provider Notes (Signed)
ED Attendings       [name] 03/30/20 19:27    History     Chief Complaint   Patient presents with    Suicidal     51M p/w feeling like he wanted to kill himself "a few days ago" but denies feelings of SI since then. pt reports recent stressor of his.child's mother passing away. pt reports h/o axniety buthas not seen a doc for it. pt denies PMH, or daily meds.        CC: SI  HPI: SI, no plan, several days, denies SI today,  Partner and mother of 72 week old infant son recently died, feeling were disclosed to inpatient physician, ED social worker made aware and escorted him to ED for further care    PMH: asthma  Medications: albuterol, prednisone             Allergies/Contraindications   Allergen Reactions    Mushroom Shortness Of Breath and Swelling     Pt reports his "throat swells up" when eats mushrooms                          Review of Systems     Review of Systems   Constitutional: Negative for chills, fatigue and fever.   HENT: Negative for congestion and rhinorrhea.    Eyes: Negative for pain, discharge, redness and visual disturbance.   Respiratory: Negative for cough, chest tightness and shortness of breath.    Cardiovascular: Negative for chest pain and leg swelling.   Gastrointestinal: Negative for abdominal distention, nausea and vomiting.   Genitourinary: Negative for dysuria and flank pain.   Musculoskeletal: Negative for back pain, gait problem and neck stiffness.   Neurological: Negative for dizziness, weakness and numbness.   Psychiatric/Behavioral: Positive for suicidal ideas. Negative for behavioral problems and confusion.   All other systems reviewed and are negative.      Physical Exam   Triage Vital Signs:  BP: 124/62, Pulse - Palpated/Pleth: 74, Temp: 36.9 C (98.4 F), *Resp: 18, SpO2: 100 %    Physical Exam  Vitals and nursing note reviewed.   Constitutional:       General: He is not in acute distress.     Appearance: He is well-developed.   HENT:      Head: Normocephalic and  atraumatic.   Eyes:      Pupils: Pupils are equal, round, and reactive to light.   Cardiovascular:      Rate and Rhythm: Normal rate and regular rhythm.   Pulmonary:      Effort: Pulmonary effort is normal. No respiratory distress.      Breath sounds: Normal breath sounds.   Abdominal:      General: There is no distension.      Palpations: Abdomen is soft.   Musculoskeletal:         General: No deformity.      Cervical back: Normal range of motion and neck supple.   Skin:     General: Skin is warm and dry.      Capillary Refill: Capillary refill takes less than 2 seconds.   Neurological:      Mental Status: He is alert.      Cranial Nerves: No cranial nerve deficit.   Psychiatric:         Behavior: Behavior normal.          Initial Assessment (problem list and differential diagnosis)  Ddx: SI, depressed mood and    Interpretations:  Lab, Imaging, and ECG     No new labs.    No new Radiology.       Reassessment and Final Disposition     7:38 PM Case discussed with Dr Susette Racer, initial plans to transport to [facility] for psych care.    Patient requesting discharge, has capacity, seen by ED SW and provided resources     Diagnosis  SI    Plan  Discharge to home            Verita Schneiders. Hale Bogus, MD  03/31/20 1057

## 2020-03-30 NOTE — Interdisciplinary (Signed)
SOCIAL WORK NOTE    PRESENTING CONCERNS    Dr. Hale Bogus informed social worker that she was discharging patient, and asked social worker to provide patient with mental health follow-up information.        ASSESSMENT  Patient/Family has limited support and may benefit from referrals and support from service providers. and Patient/Family faces barriers to follow through with recommendations.      ACTION TAKEN  Reviewed Medical Records  and Consulted with Medical Team      Social worker gave patient a printout with the following information on it:    [employer], [phone]    Crisis Text Line, [phone]    [facility] Access, [phone]    Social worker also gave patient a printout explaining each of the ways in which to apply for Medi-Cal in [place].  Patient said that he had only been in [place] for about two to three months.  Patient said that he had Medicaid in [place], where his partner first visited him before she had their baby in [place], and then patient came to [place] to be with them.  Social worker told patient that he was sorry for patient's loss of his partner, who patient said had recently died.  Patient said that he had a car, and would drive to the home of his partner's parents, where he has been staying recently.  Social worker wished patient well in seeking Medi-Cal and any services he may pursue, and said goodbye to patient as he exited the ED.        PLAN  No further needs identified at this time.  Social worker available as needed.       Joan Flores, LCSW    03/30/2020

## 2021-03-09 ENCOUNTER — Other Ambulatory Visit: Payer: Self-pay

## 2021-03-09 ENCOUNTER — Emergency Department (HOSPITAL_COMMUNITY): Payer: Self-pay

## 2021-03-09 ENCOUNTER — Emergency Department (HOSPITAL_COMMUNITY)
Admission: EM | Admit: 2021-03-09 | Discharge: 2021-03-10 | Disposition: A | Discharge status: Home / Self Care | Payer: Self-pay | Attending: Emergency Medicine | Admitting: Emergency Medicine

## 2021-03-09 ENCOUNTER — Encounter (HOSPITAL_COMMUNITY): Payer: Self-pay | Admitting: Emergency Medicine

## 2021-03-09 DIAGNOSIS — Z20822 Contact with and (suspected) exposure to covid-19: Secondary | ICD-10-CM | POA: Insufficient documentation

## 2021-03-09 DIAGNOSIS — J189 Pneumonia, unspecified organism: Secondary | ICD-10-CM | POA: Insufficient documentation

## 2021-03-09 DIAGNOSIS — J45909 Unspecified asthma, uncomplicated: Secondary | ICD-10-CM | POA: Insufficient documentation

## 2021-03-09 LAB — RESP PANEL BY RT-PCR (FLU A&B, COVID) ARPGX2
Influenza A by PCR: NEGATIVE
Influenza B by PCR: NEGATIVE
SARS Coronavirus 2 by RT PCR: NEGATIVE

## 2021-03-09 LAB — GROUP A STREP BY PCR: Group A Strep by PCR: NOT DETECTED

## 2021-03-09 NOTE — ED Provider Notes (Signed)
Emergency Medicine Provider Triage Evaluation Note  Dustin Christian , a 25 y.o. male  was evaluated in triage.  Pt complains of sore throat for the past 2 days. Constant, worse with swallowing but able to swallow. Associated congestion, subjective fever, and cough. Sister sick w/ similar sxs.  Review of Systems  Positive: Fever, congestion, cough, sore throat Negative: Dyspnea, vomiting.   Physical Exam  BP 121/72 (BP Location: Right Arm)   Pulse 77   Temp 98.6 F (37 C)   Resp 20   SpO2 94%  Gen:   Awake, no distress   Resp:  Normal effort  MSK:   Moves extremities without difficulty  Other:  Symmetric tonsillar swelling. Erythema & mild exudate present. Tolerating own secretions without difficulty, no trismus, airway is patent.   Medical Decision Making  Medically screening exam initiated at 10:43 PM.  Appropriate orders placed.  [name] was informed that the remainder of the evaluation will be completed by another provider, this initial triage assessment does not replace that evaluation, and the importance of remaining in the ED until their evaluation is complete.  Sore throat.    Desmond Lope 03/09/21 2246    Gwyneth Sprout, MD 03/10/21 2243

## 2021-03-09 NOTE — ED Triage Notes (Signed)
Patient reports sore throat and occasional dry cough for 2 days . Respirations unlabored / no fever .

## 2021-03-10 MED ORDER — DOXYCYCLINE HYCLATE 100 MG PO CAPS: 100.0000 mg | ORAL_CAPSULE | Freq: Two times a day (BID) | ORAL | 0 refills | Status: DC

## 2021-03-10 MED ORDER — AMOXICILLIN-POT CLAVULANATE 875-125 MG PO TABS: 1.0000 | ORAL_TABLET | Freq: Two times a day (BID) | ORAL | 0 refills | Status: DC

## 2021-03-10 MED ORDER — NAPROXEN 500 MG PO TABS: 500.0000 mg | ORAL_TABLET | Freq: Two times a day (BID) | ORAL | 0 refills | Status: DC | PRN

## 2021-03-10 NOTE — ED Provider Notes (Signed)
[facility] EMERGENCY DEPARTMENT Provider Note   CSN: [id number] Arrival date & time: 03/09/21  2208     History Chief Complaint  Patient presents with   Sore Throat    Randeep Biondolillo is a 25 y.o. male with a hx of asthma who presents to the ED with complaints of sore throat x 2 days. Sore throat is constant, worse with swallowing but able to swallow, no other alleviating/aggravating factors. Associated subjective fevers, nasal congestion, and productive cough. Denies vomiting, dyspnea, chest pain, or abdominal pain. Sister is sick w/ similar sxs.   HPI     Past Medical History:  Diagnosis Date   Asthma    Seasonal allergies     There are no problems to display for this patient.   History reviewed. No pertinent surgical history.     Family History  Problem Relation Age of Onset   Healthy Mother    Diabetes Father    Asthma Father    Hypertension Father     Social History   Tobacco Use   Smoking status: Never   Smokeless tobacco: Never  Vaping Use   Vaping Use: Never used  Substance Use Topics   Alcohol use: No   Drug use: No    Home Medications Prior to Admission medications   Medication Sig Start Date End Date Taking? Authorizing Provider  albuterol (VENTOLIN HFA) 108 (90 Base) MCG/ACT inhaler Inhale 2 puffs into the lungs every 6 (six) hours as needed for wheezing or shortness of breath. Patient not taking: Reported on 08/01/2019 08/16/18   Carlyle Basques P, PA-C  EPINEPHrine (PRIMATENE MIST) 0.125 MG/ACT AERO Inhale 2 puffs into the lungs every 6 (six) hours as needed (shortness of breath).    [provider]  pantoprazole (PROTONIX) 20 MG tablet Take 1 tablet (20 mg total) by mouth daily. Patient not taking: Reported on 08/01/2019 10/20/18 11/19/18  Virgina Norfolk, DO  predniSONE (DELTASONE) 10 MG tablet Take 2 tablets (20 mg total) by mouth 2 (two) times daily with a meal. 08/01/19   Margarita Grizzle, MD    Allergies    Patient has  no known allergies.  Review of Systems   Review of Systems  Constitutional:  Positive for fever.  HENT:  Positive for congestion and sore throat. Negative for ear pain.   Respiratory:  Positive for cough. Negative for shortness of breath.   Cardiovascular:  Negative for chest pain and leg swelling.  Gastrointestinal:  Negative for abdominal pain, diarrhea and vomiting.  Neurological:  Negative for syncope.  All other systems reviewed and are negative.  Physical Exam Updated Vital Signs BP 121/81   Pulse 74   Temp 97.9 F (36.6 C)   Resp 16   Ht 5\' 9"  (1.753 m)   Wt 95 kg   SpO2 93%   BMI 30.93 kg/m   Physical Exam Vitals and nursing note reviewed.  Constitutional:      General: He is not in acute distress.    Appearance: He is well-developed. He is not toxic-appearing.  HENT:     Head: Normocephalic and atraumatic.     Right Ear: Ear canal normal. Tympanic membrane is not perforated, erythematous, retracted or bulging.     Left Ear: Ear canal normal. Tympanic membrane is not perforated, erythematous, retracted or bulging.     Ears:     Comments: No mastoid erythema/swellng/tenderness.     Nose:     Right Sinus: No maxillary sinus tenderness or frontal sinus  tenderness.     Left Sinus: No maxillary sinus tenderness or frontal sinus tenderness.     Mouth/Throat:     Pharynx: Uvula midline. Posterior oropharyngeal erythema present.     Tonsils: Tonsillar exudate (minimal) present. 2+ on the right. 2+ on the left.     Comments: Posterior oropharynx is symmetric appearing. Patient tolerating own secretions without difficulty. No trismus. No drooling. No hot potato voice. No swelling beneath the tongue, submandibular compartment is soft.  Eyes:     General:        Right eye: No discharge.        Left eye: No discharge.     Conjunctiva/sclera: Conjunctivae normal.  Cardiovascular:     Rate and Rhythm: Normal rate and regular rhythm.  Pulmonary:     Effort: Pulmonary effort  is normal. No respiratory distress.     Breath sounds: Rhonchi (@ the base- cleared with cough) present. No wheezing or rales.  Abdominal:     General: There is no distension.     Palpations: Abdomen is soft.     Tenderness: There is no abdominal tenderness.  Musculoskeletal:     Cervical back: Neck supple. No rigidity.  Lymphadenopathy:     Cervical: No cervical adenopathy.  Skin:    General: Skin is warm and dry.     Findings: No rash.  Neurological:     Mental Status: He is alert.  Psychiatric:        Behavior: Behavior normal.    ED Results / Procedures / Treatments   Labs (all labs ordered are listed, but only abnormal results are displayed) Labs Reviewed  GROUP A STREP BY PCR  RESP PANEL BY RT-PCR (FLU A&B, COVID) ARPGX2    EKG None  Radiology DG Chest 2 View  Result Date: 03/09/2021 CLINICAL DATA:  Cough and fever. EXAM: CHEST - 2 VIEW COMPARISON:  08/01/2019 FINDINGS: The cardiomediastinal contours are normal. Minimal patchy opacity in the right middle lobe. Pulmonary vasculature is normal. No pleural effusion or pneumothorax. No acute osseous abnormalities are seen. IMPRESSION: Minimal patchy opacity in the right middle lobe may reflect atelectasis or pneumonia. Electronically Signed   By: Narda Rutherford M.D.   On: 03/09/2021 23:16    Procedures Procedures   Medications Ordered in ED Medications - No data to display  ED Course  I have reviewed the triage vital signs and the nursing notes.  Pertinent labs & imaging results that were available during my care of the patient were reviewed by me and considered in my medical decision making (see chart for details).  Wyman Meschke was evaluated in Emergency Department on 03/10/2021 for the symptoms described in the history of present illness. He/she was evaluated in the context of the global COVID-19 pandemic, which necessitated consideration that the patient might be at risk for infection with the SARS-CoV-2 virus  that causes COVID-19. Institutional protocols and algorithms that pertain to the evaluation of patients at risk for COVID-19 are in a state of rapid change based on information released by regulatory bodies including the CDC and federal and state organizations. These policies and algorithms were followed during the patient's care in the ED.     MDM Rules/Calculators/A&P                           Patient presents to the ED with complaints of URI sxs.  Nontoxic, vitals without significant abnormality.   Additional history obtained:  Additional  history obtained from chart review & nursing note review.  Last creatinine WNL.   Lab Tests:  I Ordered, reviewed, and interpreted labs, which included:  Strep- negative Covid/flu- negative.   Imaging Studies ordered:  I ordered imaging studies which included CXR, I independently reviewed, formal radiology impression shows: Minimal patchy opacity in the right middle lobe may reflect atelectasis or pneumonia  ED Course:  Patient overall well appearing.  Tonsillar erythema/swelling/exudates, strep negative, exam not consistent w/ RPA/PTA, tolerating own secretions without difficulty with patent airway.  No meningismus.  CXR w/ pneumonia vs. Atelectasis, patient not in respiratory distress, overall appears reasonable for discharge, will start abx to cover for CAP, hx of lung disease therefore will cover with combo therapy (Asthma), no wheezing to suggest asthma exacerbation. I discussed results, treatment plan, need for follow-up, and return precautions with the patient. Provided opportunity for questions, patient confirmed understanding and is in agreement with plan.   Portions of this note were generated with Scientist, clinical (histocompatibility and immunogenetics). Dictation errors may occur despite best attempts at proofreading.  Final Clinical Impression(s) / ED Diagnoses Final diagnoses:  Community acquired pneumonia of right lung, unspecified part of lung    Rx / DC  Orders ED Discharge Orders          Ordered    doxycycline (VIBRAMYCIN) 100 MG capsule  2 times daily        03/10/21 0334    amoxicillin-clavulanate (AUGMENTIN) 875-125 MG tablet  Every 12 hours        03/10/21 0334    naproxen (NAPROSYN) 500 MG tablet  2 times daily PRN        03/10/21 0334             Cherly Anderson, PA-C 03/10/21 0336    Glynn Octave, MD 03/10/21 [phone]

## 2021-03-10 NOTE — Discharge Instructions (Addendum)
You were seen in the ER and found to have pneumonia Your strep, covid, and flu test were negative We are sending you home with doxycycline & augmentin which are antibiotics and naproxen to help with pain/swelling.   Please take all of your antibiotics until finished. You may develop abdominal discomfort or diarrhea from the antibiotic.  You may help offset this with probiotics which you can buy at the store (ask your pharmacist if unable to find) or get probiotics in the form of eating yogurt.  If you are unable to tolerate these side effects follow-up with your primary care provider or return to the emergency department.   - Naproxen- this is a nonsteroidal anti-inflammatory medication that will help with pain and swelling. Be sure to take this medication as prescribed with food, 1 pill every 12 hours,  It should be taken with food, as it can cause stomach upset, and more seriously, stomach bleeding. Do not take other nonsteroidal anti-inflammatory medications with this such as Advil, Motrin, Aleve, Mobic, Goodie Powder, or Motrin etc..    You make take Tylenol per over the counter dosing with these medications.   We have prescribed you new medication(s) today. Discuss the medications prescribed today with your pharmacist as they can have adverse effects and interactions with your other medicines including over the counter and prescribed medications. Seek medical evaluation if you start to experience new or abnormal symptoms after taking one of these medicines, seek care immediately if you start to experience difficulty breathing, feeling of your throat closing, facial swelling, or rash as these could be indications of a more serious allergic reaction  Please follow up with primary care.  Return to the Er for new or worsening symptoms including but not limited to new or worsening pain, trouble breathing, passing out, chest pain, coughing up blood, inability to swallow/open your mouth, or any other  concerns.

## 2021-04-03 ENCOUNTER — Emergency Department (HOSPITAL_COMMUNITY)
Admission: EM | Admit: 2021-04-03 | Discharge: 2021-04-03 | Disposition: A | Discharge status: Home / Self Care | Payer: Medicaid Other | Attending: Emergency Medicine | Admitting: Emergency Medicine

## 2021-04-03 ENCOUNTER — Encounter (HOSPITAL_COMMUNITY): Payer: Self-pay

## 2021-04-03 DIAGNOSIS — Z20822 Contact with and (suspected) exposure to covid-19: Secondary | ICD-10-CM | POA: Insufficient documentation

## 2021-04-03 DIAGNOSIS — J029 Acute pharyngitis, unspecified: Secondary | ICD-10-CM | POA: Insufficient documentation

## 2021-04-03 DIAGNOSIS — J45909 Unspecified asthma, uncomplicated: Secondary | ICD-10-CM | POA: Insufficient documentation

## 2021-04-03 LAB — GROUP A STREP BY PCR: Group A Strep by PCR: NOT DETECTED

## 2021-04-03 LAB — RESP PANEL BY RT-PCR (FLU A&B, COVID) ARPGX2
Influenza A by PCR: NEGATIVE
Influenza B by PCR: NEGATIVE
SARS Coronavirus 2 by RT PCR: NEGATIVE

## 2021-04-03 MED ORDER — CEPHALEXIN 500 MG PO CAPS: ORAL_CAPSULE | ORAL | 0 refills | Status: DC

## 2021-04-03 NOTE — ED Triage Notes (Incomplete)
Pt states that he has had a sore throat for the past 2 week, recently had pneumonia and just finished antibiotics but has not getting better.

## 2021-04-03 NOTE — ED Triage Notes (Signed)
Pt states that he has had a sore throat for the past two weeks, recently had pneumonia and finished antibiotics without relief, continues to cough up green phlegm

## 2021-04-03 NOTE — ED Provider Notes (Signed)
[facility] EMERGENCY DEPARTMENT Provider Note   CSN: [id number] Arrival date & time: 04/03/21  2132     History Chief Complaint  Patient presents with   Sore Throat    Dustin Christian is a 25 y.o. male.  Patient complains of a sore throat and cough.  He was treated last month for the same symptoms  The history is provided by the patient and medical records. No language interpreter was used.  Sore Throat This is a recurrent problem. The current episode started more than 2 days ago. The problem occurs constantly. The problem has not changed since onset.Pertinent negatives include no chest pain, no abdominal pain and no headaches. Nothing aggravates the symptoms. Nothing relieves the symptoms. He has tried nothing for the symptoms.      Past Medical History:  Diagnosis Date   Asthma    Seasonal allergies     There are no problems to display for this patient.   History reviewed. No pertinent surgical history.     Family History  Problem Relation Age of Onset   Healthy Mother    Diabetes Father    Asthma Father    Hypertension Father     Social History   Tobacco Use   Smoking status: Never   Smokeless tobacco: Never  Vaping Use   Vaping Use: Never used  Substance Use Topics   Alcohol use: No   Drug use: No    Home Medications Prior to Admission medications   Medication Sig Start Date End Date Taking? Authorizing Provider  cephALEXin (KEFLEX) 500 MG capsule 2 caps po bid x 7 days 04/03/21  Yes Bethann Berkshire, MD  amoxicillin-clavulanate (AUGMENTIN) 875-125 MG tablet Take 1 tablet by mouth every 12 (twelve) hours. 03/10/21   Petrucelli, Samantha R, PA-C  doxycycline (VIBRAMYCIN) 100 MG capsule Take 1 capsule (100 mg total) by mouth 2 (two) times daily. 03/10/21   Petrucelli, Samantha R, PA-C  EPINEPHrine (PRIMATENE MIST) 0.125 MG/ACT AERO Inhale 2 puffs into the lungs every 6 (six) hours as needed (shortness of breath).    [provider]  naproxen (NAPROSYN) 500 MG tablet Take 1 tablet (500 mg total) by mouth 2 (two) times daily as needed for moderate pain. 03/10/21   Petrucelli, Lelon Mast R, PA-C  predniSONE (DELTASONE) 10 MG tablet Take 2 tablets (20 mg total) by mouth 2 (two) times daily with a meal. 08/01/19   Margarita Grizzle, MD    Allergies    Patient has no known allergies.  Review of Systems   Review of Systems  Constitutional:  Negative for appetite change and fatigue.  HENT:  Negative for congestion, ear discharge and sinus pressure.        Sore throat  Eyes:  Negative for discharge.  Respiratory:  Negative for cough.   Cardiovascular:  Negative for chest pain.  Gastrointestinal:  Negative for abdominal pain and diarrhea.  Genitourinary:  Negative for frequency and hematuria.  Musculoskeletal:  Negative for back pain.  Skin:  Negative for rash.  Neurological:  Negative for seizures and headaches.  Psychiatric/Behavioral:  Negative for hallucinations.    Physical Exam Updated Vital Signs BP 135/70 (BP Location: Left Arm)    Pulse 96    Temp 97.8 F (36.6 C) (Oral)    Resp 16    Ht 5\' 8"  (1.727 m)    Wt 88.5 kg    SpO2 93%    BMI 29.65 kg/m   Physical Exam Vitals and nursing note reviewed.  Constitutional:      Appearance: He is well-developed.  HENT:     Head: Normocephalic.     Comments: Pharynx inflamed    Mouth/Throat:     Mouth: Mucous membranes are moist.  Eyes:     General: No scleral icterus.    Conjunctiva/sclera: Conjunctivae normal.  Neck:     Thyroid: No thyromegaly.  Cardiovascular:     Rate and Rhythm: Normal rate and regular rhythm.     Heart sounds: No murmur heard.   No friction rub. No gallop.  Pulmonary:     Breath sounds: No stridor. No wheezing or rales.  Chest:     Chest wall: No tenderness.  Abdominal:     General: There is no distension.     Tenderness: There is no abdominal tenderness. There is no rebound.  Musculoskeletal:        General: Normal range of motion.      Cervical back: Neck supple.  Lymphadenopathy:     Cervical: No cervical adenopathy.  Skin:    Findings: No erythema or rash.  Neurological:     Mental Status: He is alert and oriented to person, place, and time.     Motor: No abnormal muscle tone.     Coordination: Coordination normal.  Psychiatric:        Behavior: Behavior normal.    ED Results / Procedures / Treatments   Labs (all labs ordered are listed, but only abnormal results are displayed) Labs Reviewed  GROUP A STREP BY PCR  RESP PANEL BY RT-PCR (FLU A&B, COVID) ARPGX2    EKG None  Radiology No results found.  Procedures Procedures   Medications Ordered in ED Medications - No data to display  ED Course  I have reviewed the triage vital signs and the nursing notes.  Pertinent labs & imaging results that were available during my care of the patient were reviewed by me and considered in my medical decision making (see chart for details).    MDM Rules/Calculators/A&P Patient with persistent sore throat.  Strep and flu negative.  Patient will be placed on Keflex and follow-up with ENT    Final Clinical Impression(s) / ED Diagnoses Final diagnoses:  Pharyngitis, unspecified etiology    Rx / DC Orders ED Discharge Orders          Ordered    cephALEXin (KEFLEX) 500 MG capsule        04/03/21 2323             Milton Ferguson, MD 04/07/21 1134

## 2021-04-03 NOTE — Discharge Instructions (Signed)
Follow-up with the throat doctors here in charge treat if this medicine does not fix her sore throat

## 2021-04-03 NOTE — ED Provider Notes (Signed)
Emergency Medicine Provider Triage Evaluation Note  Dustin Christian , a 25 y.o. male  was evaluated in triage.  Pt complains of sore throat.  Been going on for about 2 weeks, constant and worse when he swallows.  Has tried eating cold ice cream which is somewhat helped.  Initiate antibiotics, sore throat has not improved.  No difficulty handling secretions, denies fevers..  Review of Systems  Positive: above Negative: above  Physical Exam  There were no vitals taken for this visit. Gen:   Awake, no distress   Resp:  Normal effort  MSK:   Moves extremities without difficulty  Other:  Bilateral tonsillar swelling without any reachable abscess. Uvula midline. No sublingual tenderness, handling secretions without difficulty.  No submandibular swelling or tenderness.  Medical Decision Making  Medically screening exam initiated at 9:39 PM.  Appropriate orders placed.  Dustin Christian was informed that the remainder of the evaluation will be completed by another provider, this initial triage assessment does not replace that evaluation, and the importance of remaining in the ED until their evaluation is complete.  Strep swab. Stable vitals    Dustin Christian, Dustin Christian 04/03/21 2140    Dustin Berkshire, MD 04/03/21 2337

## 2021-04-28 ENCOUNTER — Ambulatory Visit: Payer: Self-pay

## 2021-08-10 ENCOUNTER — Emergency Department (HOSPITAL_COMMUNITY)
Admission: EM | Admit: 2021-08-10 | Discharge: 2021-08-10 | Disposition: A | Discharge status: Home / Self Care | Payer: 59 | Attending: Emergency Medicine | Admitting: Emergency Medicine

## 2021-08-10 ENCOUNTER — Encounter (HOSPITAL_COMMUNITY): Payer: Self-pay

## 2021-08-10 ENCOUNTER — Other Ambulatory Visit: Payer: Self-pay

## 2021-08-10 ENCOUNTER — Emergency Department (HOSPITAL_COMMUNITY): Payer: 59

## 2021-08-10 DIAGNOSIS — R102 Pelvic and perineal pain: Secondary | ICD-10-CM | POA: Diagnosis present

## 2021-08-10 DIAGNOSIS — N201 Calculus of ureter: Secondary | ICD-10-CM | POA: Diagnosis not present

## 2021-08-10 LAB — CBC
HCT: 43.2 % (ref 39.0–52.0)
Hemoglobin: 15.5 g/dL (ref 13.0–17.0)
MCH: 32.4 pg (ref 26.0–34.0)
MCHC: 35.9 g/dL (ref 30.0–36.0)
MCV: 90.2 fL (ref 80.0–100.0)
Platelets: UNDETERMINED 10*3/uL (ref 150–400)
RBC: 4.79 MIL/uL (ref 4.22–5.81)
RDW: 12 % (ref 11.5–15.5)
WBC: 4.7 10*3/uL (ref 4.0–10.5)
nRBC: 0 % (ref 0.0–0.2)

## 2021-08-10 LAB — URINALYSIS, ROUTINE W REFLEX MICROSCOPIC
Bilirubin Urine: NEGATIVE
Glucose, UA: NEGATIVE mg/dL
Ketones, ur: NEGATIVE mg/dL
Leukocytes,Ua: NEGATIVE
Nitrite: NEGATIVE
Protein, ur: 30 mg/dL — AB
RBC / HPF: >50 RBC/hpf — ABNORMAL HIGH (ref 0–5)
Specific Gravity, Urine: 1.026 (ref 1.005–1.030)
pH: 5 (ref 5.0–8.0)

## 2021-08-10 LAB — COMPREHENSIVE METABOLIC PANEL
ALT: 43 U/L (ref 0–44)
AST: 37 U/L (ref 15–41)
Albumin: 3.7 g/dL (ref 3.5–5.0)
Alkaline Phosphatase: 70 U/L (ref 38–126)
Anion gap: 7 (ref 5–15)
BUN: 7 mg/dL (ref 6–20)
CO2: 25 mmol/L (ref 22–32)
Calcium: 8.8 mg/dL — ABNORMAL LOW (ref 8.9–10.3)
Chloride: 107 mmol/L (ref 98–111)
Creatinine, Ser: 1.09 mg/dL (ref 0.61–1.24)
GFR, Estimated: >60 mL/min (ref >60)
Glucose, Bld: 105 mg/dL — ABNORMAL HIGH (ref 70–99)
Potassium: 4.1 mmol/L (ref 3.5–5.1)
Sodium: 139 mmol/L (ref 135–145)
Total Bilirubin: 0.4 mg/dL (ref 0.3–1.2)
Total Protein: 7.3 g/dL (ref 6.5–8.1)

## 2021-08-10 LAB — LIPASE, BLOOD: Lipase: 46 U/L (ref 11–51)

## 2021-08-10 MED ORDER — HYDROMORPHONE HCL 1 MG/ML IJ SOLN
1.0000 mg | Freq: Once | INTRAMUSCULAR | Status: AC
  Administered 2021-08-10: 1 mg via INTRAVENOUS
  Filled 2021-08-10: qty 1

## 2021-08-10 MED ORDER — LACTATED RINGERS IV BOLUS
1000.0000 mL | Freq: Once | INTRAVENOUS | Status: AC
  Administered 2021-08-10: 1000 mL via INTRAVENOUS

## 2021-08-10 MED ORDER — TAMSULOSIN HCL 0.4 MG PO CAPS
0.4000 mg | ORAL_CAPSULE | Freq: Once | ORAL | Status: AC
  Administered 2021-08-10: 0.4 mg via ORAL
  Filled 2021-08-10: qty 1

## 2021-08-10 MED ORDER — ONDANSETRON HCL 4 MG PO TABS: 4.0000 mg | ORAL_TABLET | Freq: Four times a day (QID) | ORAL | 0 refills | Status: DC

## 2021-08-10 MED ORDER — KETOROLAC TROMETHAMINE 15 MG/ML IJ SOLN
15.0000 mg | Freq: Once | INTRAMUSCULAR | Status: AC
  Administered 2021-08-10: 15 mg via INTRAVENOUS
  Filled 2021-08-10: qty 1

## 2021-08-10 MED ORDER — OXYCODONE-ACETAMINOPHEN 5-325 MG PO TABS: 1.0000 | ORAL_TABLET | Freq: Four times a day (QID) | ORAL | 0 refills | Status: DC | PRN

## 2021-08-10 MED ORDER — TAMSULOSIN HCL 0.4 MG PO CAPS: 0.4000 mg | ORAL_CAPSULE | Freq: Every day | ORAL | 0 refills | Status: DC

## 2021-08-10 NOTE — ED Triage Notes (Signed)
Pt arrived POV from home c/o lower abdominal pain that started 30 mins prior. Pt endorses N/V/D. Pt also endorses difficulty urinating.  ?

## 2021-08-10 NOTE — ED Provider Notes (Signed)
?[facility] EMERGENCY DEPARTMENT ?Provider Note ? ? ?CSN: [id number] ?Arrival date & time: 08/10/21  0435 ? ?  ? ?History ? ?Chief Complaint  ?Patient presents with  ? Abdominal Pain  ? ? ?Dustin Christian is a 26 y.o. male who presents to the ED for evaluation of sudden onset pelvic pain that started early this morning.  Patient describes his pain as sharp, radiating down into his flank and his testicles.  No treatment prior to arrival.  He also endorses nausea, vomiting and diarrhea and has had difficulty producing urine stream.  No prior history of kidney stones.  Denies fever, chills ? ? ?Abdominal Pain ? ?  ? ?Home Medications ?Prior to Admission medications   ?Medication Sig Start Date End Date Taking? Authorizing Provider  ?acetaminophen (TYLENOL) 500 MG tablet Take 500 mg by mouth every 6 (six) hours as needed for mild pain.   Yes [provider]  ?ibuprofen (ADVIL) 200 MG tablet Take 200 mg by mouth every 6 (six) hours as needed for mild pain or headache.   Yes [provider]  ?ondansetron (ZOFRAN) 4 MG tablet Take 1 tablet (4 mg total) by mouth every 6 (six) hours. 08/10/21  Yes Raynald Blend R, PA-C  ?oxyCODONE-acetaminophen (PERCOCET/ROXICET) 5-325 MG tablet Take 1 tablet by mouth every 6 (six) hours as needed for severe pain. 08/10/21  Yes Raynald Blend R, PA-C  ?tamsulosin (FLOMAX) 0.4 MG CAPS capsule Take 1 capsule (0.4 mg total) by mouth daily. 08/10/21  Yes Janell Quiet, PA-C  ?Tetrahydrozoline HCl (VISINE OP) Apply 1 drop to eye 2 (two) times daily as needed (dry eyes).   Yes [provider]  ?amoxicillin-clavulanate (AUGMENTIN) 875-125 MG tablet Take 1 tablet by mouth every 12 (twelve) hours. ?Patient not taking: Reported on 08/10/2021 03/10/21   Cherly Anderson, PA-C  ?cephALEXin (KEFLEX) 500 MG capsule 2 caps po bid x 7 days ?Patient not taking: Reported on 08/10/2021 04/03/21   Bethann Berkshire, MD  ?doxycycline (VIBRAMYCIN) 100 MG capsule Take 1  capsule (100 mg total) by mouth 2 (two) times daily. ?Patient not taking: Reported on 08/10/2021 03/10/21   Petrucelli, Lelon Mast R, PA-C  ?naproxen (NAPROSYN) 500 MG tablet Take 1 tablet (500 mg total) by mouth 2 (two) times daily as needed for moderate pain. ?Patient not taking: Reported on 08/10/2021 03/10/21   Cherly Anderson, PA-C  ?   ? ?Allergies    ?Mushroom extract complex   ? ?Review of Systems   ?Review of Systems  ?Gastrointestinal:  Positive for abdominal pain.  ? ?Physical Exam ?Updated Vital Signs ?BP (!) 105/51   Pulse 63   Temp (!) 97.5 ?F (36.4 ?C) (Oral)   Resp 17   Ht 5\' 8"  (1.727 m)   Wt 93 kg   SpO2 98%   BMI 31.17 kg/m?  ?Physical Exam ?Vitals and nursing note reviewed. Exam conducted with a chaperone present.  ?Constitutional:   ?   General: He is not in acute distress. ?   Appearance: He is not ill-appearing.  ?   Comments: Pacing around the room, hunching over, unwilling to sit down due to abdominal pain  ?HENT:  ?   Head: Atraumatic.  ?Eyes:  ?   Conjunctiva/sclera: Conjunctivae normal.  ?Cardiovascular:  ?   Rate and Rhythm: Normal rate and regular rhythm.  ?   Pulses: Normal pulses.  ?   Heart sounds: No murmur heard. ?Pulmonary:  ?   Effort: Pulmonary effort is normal. No respiratory  distress.  ?   Breath sounds: Normal breath sounds.  ?Abdominal:  ?   General: Abdomen is flat. There is no distension.  ?   Palpations: Abdomen is soft.  ?   Tenderness: There is abdominal tenderness in the suprapubic area. There is no right CVA tenderness or left CVA tenderness.  ?Genitourinary: ?   Testes: Normal.  ?Musculoskeletal:     ?   General: Normal range of motion.  ?   Cervical back: Normal range of motion.  ?Skin: ?   General: Skin is warm and dry.  ?   Capillary Refill: Capillary refill takes less than 2 seconds.  ?Neurological:  ?   General: No focal deficit present.  ?   Mental Status: He is alert.  ?Psychiatric:     ?   Mood and Affect: Mood normal.  ? ? ?ED Results / Procedures  / Treatments   ?Labs ?(all labs ordered are listed, but only abnormal results are displayed) ?Labs Reviewed  ?COMPREHENSIVE METABOLIC PANEL - Abnormal; Notable for the following components:  ?    Result Value  ? Glucose, Bld 105 (*)   ? Calcium 8.8 (*)   ? All other components within normal limits  ?URINALYSIS, ROUTINE W REFLEX MICROSCOPIC - Abnormal; Notable for the following components:  ? Color, Urine AMBER (*)   ? APPearance HAZY (*)   ? Hgb urine dipstick LARGE (*)   ? Protein, ur 30 (*)   ? RBC / HPF >50 (*)   ? Bacteria, UA FEW (*)   ? All other components within normal limits  ?LIPASE, BLOOD  ?CBC  ? ? ?EKG ?None ? ?Radiology ?CT Renal Stone Study ? ?Result Date: 08/10/2021 ?CLINICAL DATA:  Flank pain. EXAM: CT ABDOMEN AND PELVIS WITHOUT CONTRAST TECHNIQUE: Multidetector CT imaging of the abdomen and pelvis was performed following the standard protocol without IV contrast. RADIATION DOSE REDUCTION: This exam was performed according to the departmental dose-optimization program which includes automated exposure control, adjustment of the mA and/or kV according to patient size and/or use of iterative reconstruction technique. COMPARISON:  None. FINDINGS: Lower chest: No acute abnormality. Hepatobiliary: No focal liver abnormality is seen. No gallstones, gallbladder wall thickening, or biliary dilatation. Pancreas: Unremarkable. No pancreatic ductal dilatation or surrounding inflammatory changes. Spleen: Normal in size without focal abnormality. Adrenals/Urinary Tract: Adrenal glands are unremarkable. 3 mm distal right ureteral calculus resulting in mild right hydronephrosis. No other urolithiasis. No renal mass. Normal bladder. Stomach/Bowel: Stomach is within normal limits. No evidence of bowel wall thickening, distention, or inflammatory changes. Appendix is normal. Vascular/Lymphatic: No significant vascular findings are present. No enlarged abdominal or pelvic lymph nodes. Reproductive: Prostate is  unremarkable. Other: No abdominal wall hernia or abnormality. No abdominopelvic ascites. Musculoskeletal: No acute osseous abnormality. No aggressive osseous lesion. IMPRESSION: 1. A 3 mm distal right ureteral calculus resulting in mild right hydronephrosis. Electronically Signed   By: Elige KoHetal  Patel M.D.   On: 08/10/2021 08:20   ? ?Procedures ?Procedures  ? ? ?Medications Ordered in ED ?Medications  ?tamsulosin (FLOMAX) capsule 0.4 mg (has no administration in time range)  ?HYDROmorphone (DILAUDID) injection 1 mg (1 mg Intravenous Given 08/10/21 0726)  ?ketorolac (TORADOL) 15 MG/ML injection 15 mg (15 mg Intravenous Given 08/10/21 0727)  ?lactated ringers bolus 1,000 mL (1,000 mLs Intravenous New Bag/Given 08/10/21 0729)  ? ? ?ED Course/ Medical Decision Making/ A&P ?  ?                        ?  Medical Decision Making ?Amount and/or Complexity of Data Reviewed ?Labs: ordered. ?Radiology: ordered. ? ?Risk ?Prescription drug management. ? ? ?History:  ?Per HPI ?Social determinants of health: none ? ?Initial impression: ? ?This patient presents to the ED for concern of abdominal pain, this involves an extensive number of treatment options, and is a complaint that carries with it a high risk of complications and morbidity.   Immediate differentials include kidney stone, pyelonephritis, UTI, STD and testicular torsion ? ? ?Lab Tests and EKG: ? ?I Ordered, reviewed, and interpreted labs and EKG.  The pertinent results include:  ?CMP, CBC and lipase without acute findings ?UA with large amounts of blood without evidence of infection ? ? ?Imaging Studies ordered: ? ?I ordered imaging studies including  ?CT renal stone which shows 3 mm stone in the distal left ureter with mild hydronephrosis ?I independently visualized and interpreted imaging and I agree with the radiologist interpretation.  ? ? ?Cardiac Monitoring: ? ?The patient was maintained on a cardiac monitor.  I personally viewed and interpreted the cardiac monitored  which showed an underlying rhythm of: Sinus tachycardia ? ? ?Medicines ordered and prescription drug management: ? ?I ordered medication including: ?Dilaudid 1 mg ?Toradol 15 mg IV ?1 L lactated Ringer's ?Flomax 0.4 ?Re

## 2021-08-10 NOTE — Discharge Instructions (Addendum)
CT scan today shows that you have a small kidney stone in your ureter which is what is causing your significant pain.  It is fairly small and should pass on its own in the next couple of days.  Continue drinking plenty of water, at least 3 L a day to help flush it out.  I have also sent in a prescription for a medication called Flomax which should help relax your ureter to help the kidney stone pass, and I have sent in some nausea medication as well as a pain medication.  Please follow-up with the urology referral provided above later this week, and I hope you feel better soon. ?

## 2021-12-17 IMAGING — CR DG CHEST 2V
2 series · 2 of 2 positions shown · non-contrast
Comparison: 08/01/2019

CLINICAL DATA: Cough and fever.

EXAM:
CHEST - 2 VIEW

[chest pa]
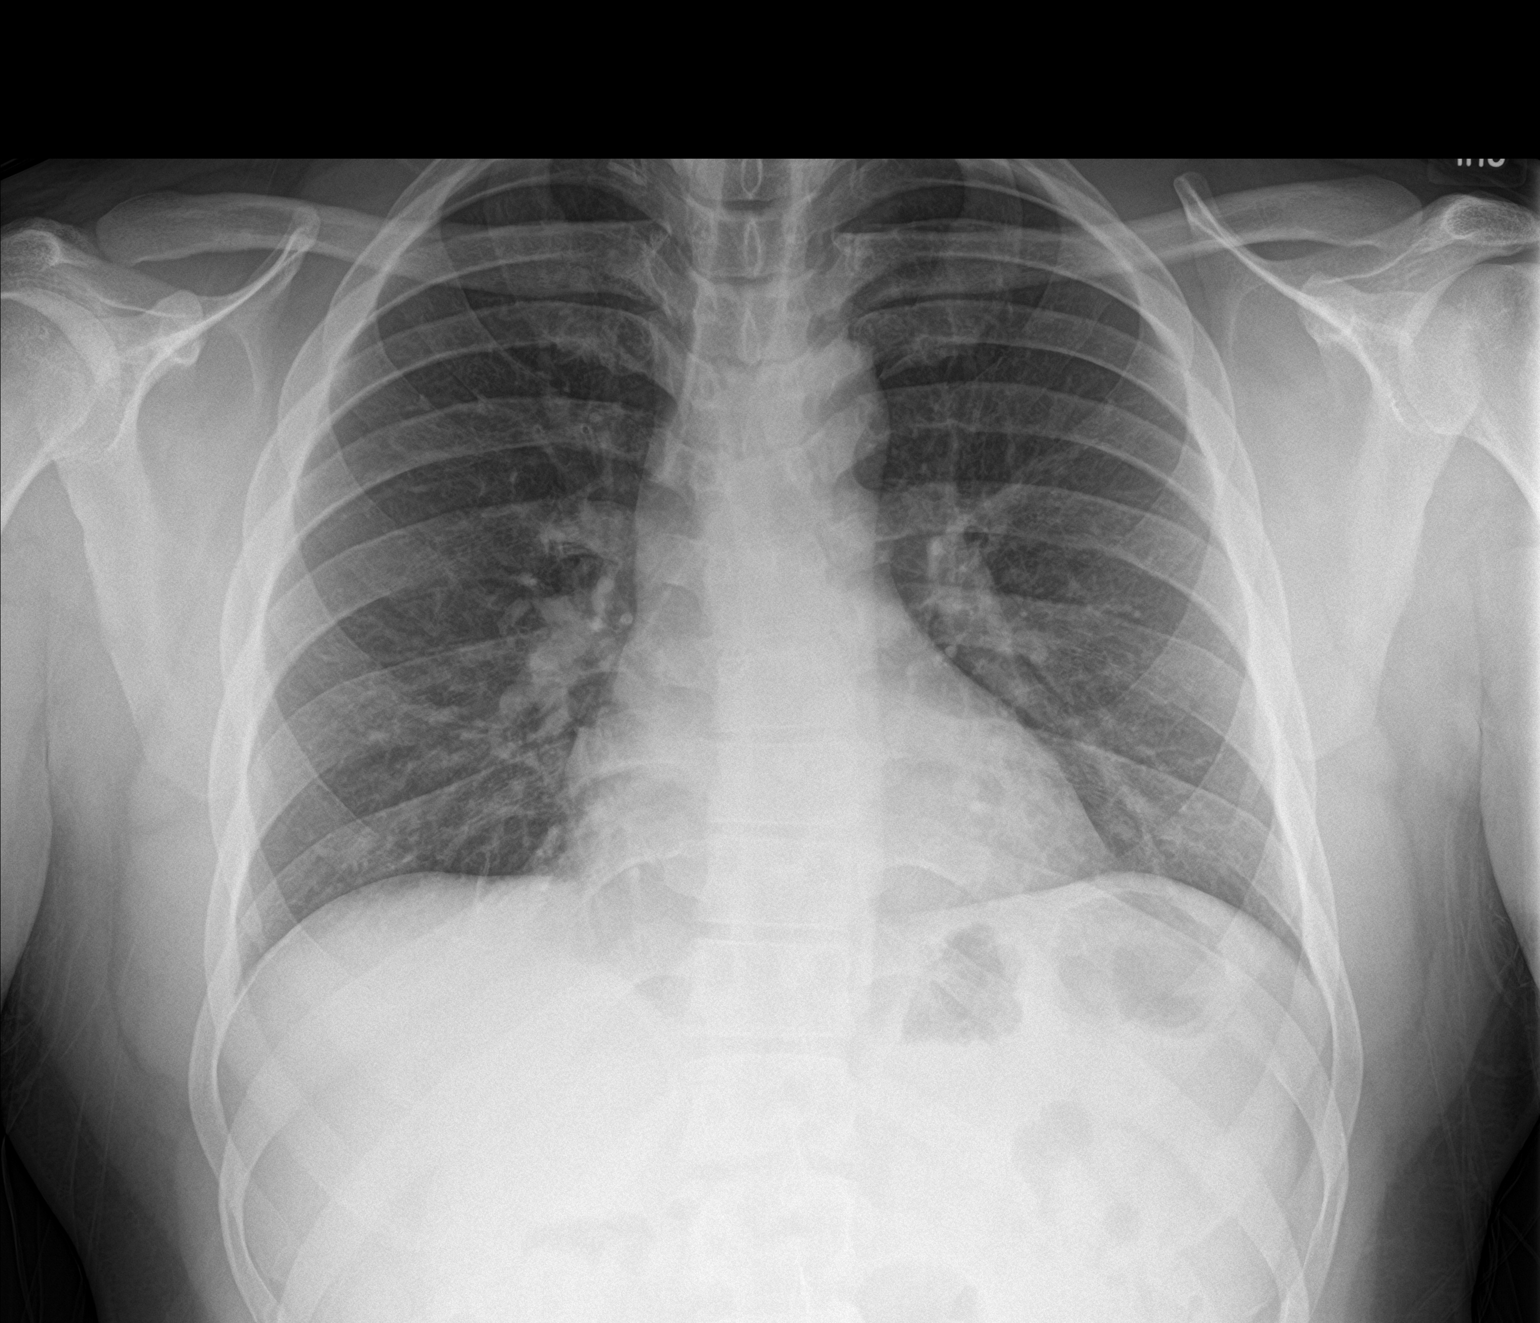

[chest lat]
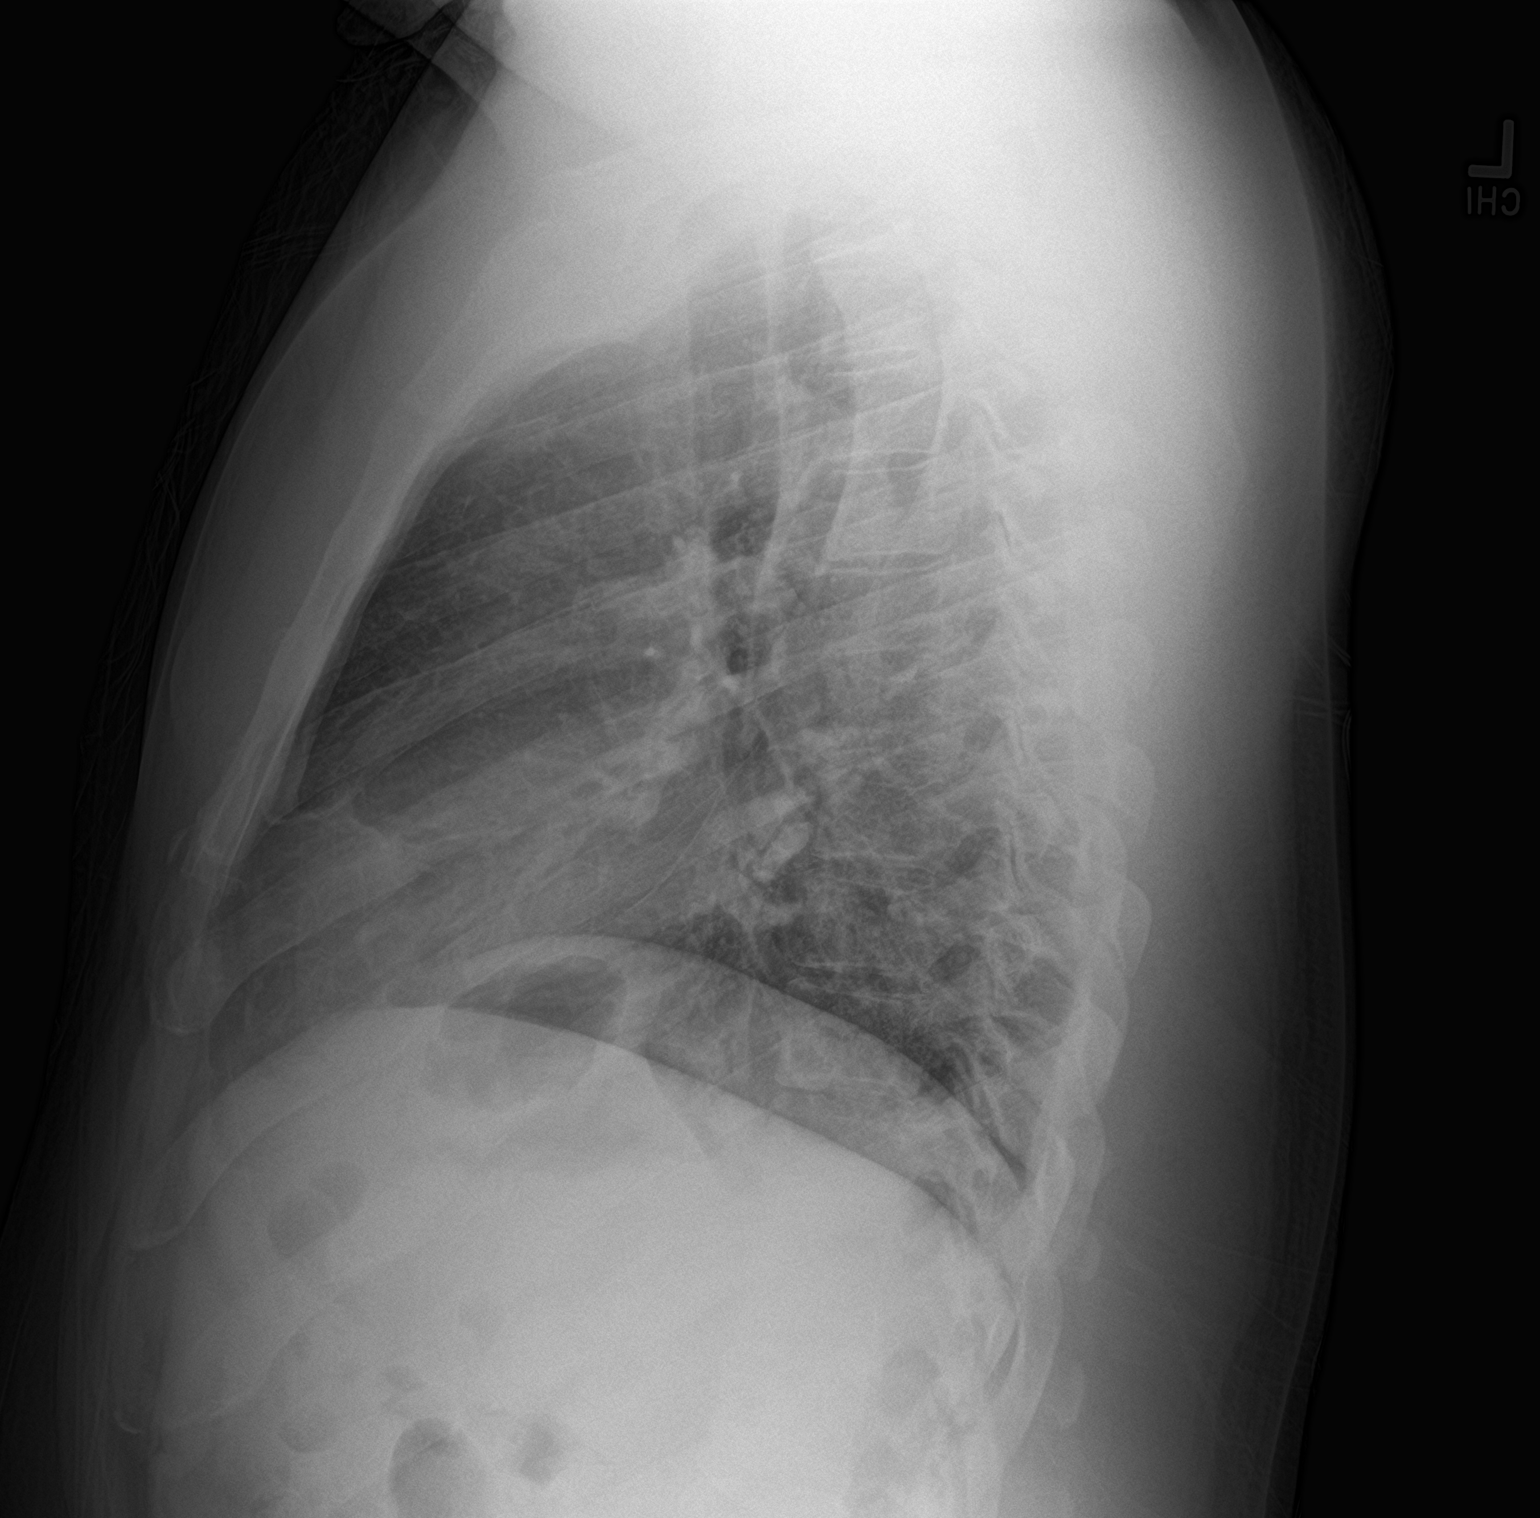

[2 of 2 positions shown; findings below may reference images not displayed]

FINDINGS: The cardiomediastinal contours are normal. Minimal patchy opacity in
the right middle lobe. Pulmonary vasculature is normal. No pleural
effusion or pneumothorax. No acute osseous abnormalities are seen.
IMPRESSION: Minimal patchy opacity in the right middle lobe may reflect
atelectasis or pneumonia.

## 2022-05-20 IMAGING — CT CT RENAL STONE PROTOCOL
2 of 4 series · 16 of 46 positions shown, 18 images · non-contrast
Comparison: None.

CLINICAL DATA: Flank pain.



[Series 3: stone study 5.0 i30f 2 · axial · 0.79mm/px · z∈[+959,+1414]mm · 13 of 101 slices shown, 15 images]
[im 5/101  soft-tissue]
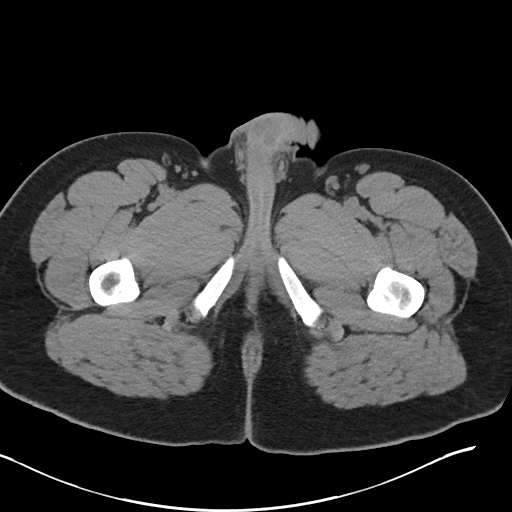
[im 5/101  bone]
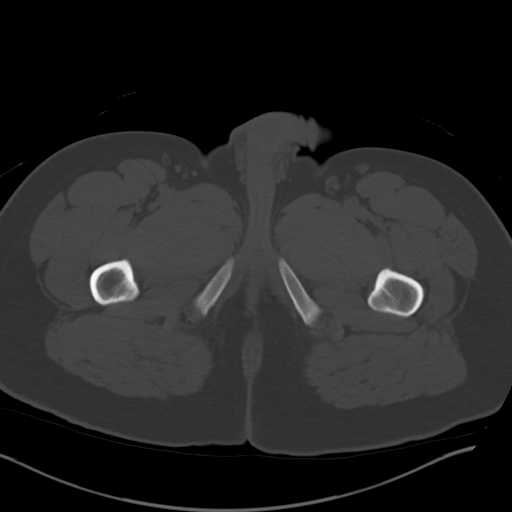
[im 14/101  soft-tissue]
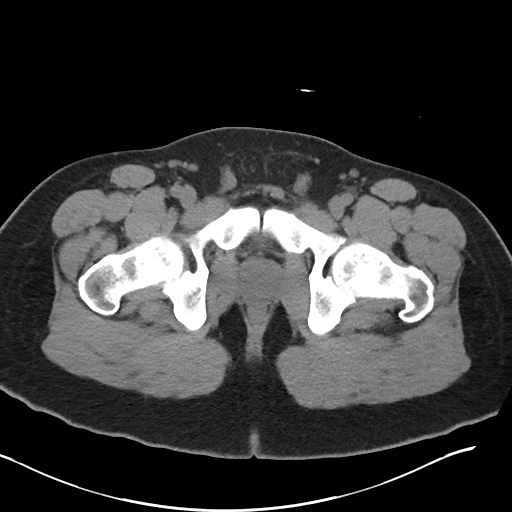
[im 22/101  soft-tissue]
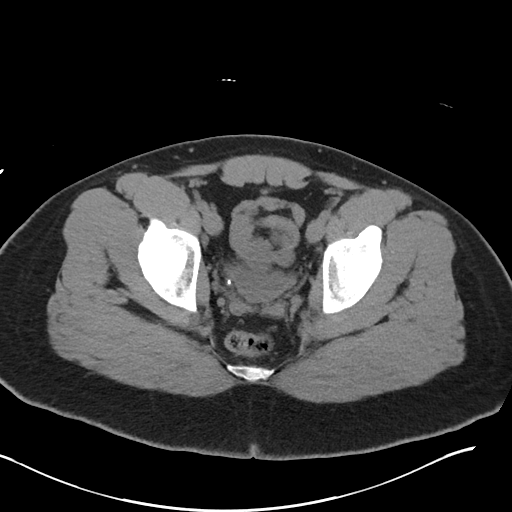
[im 27/101  soft-tissue]
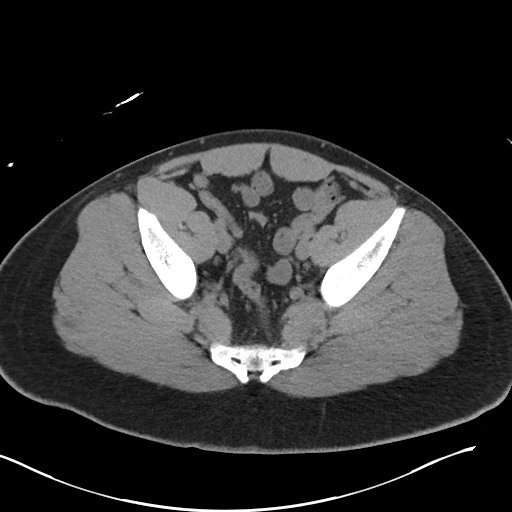
[im 35/101  soft-tissue]
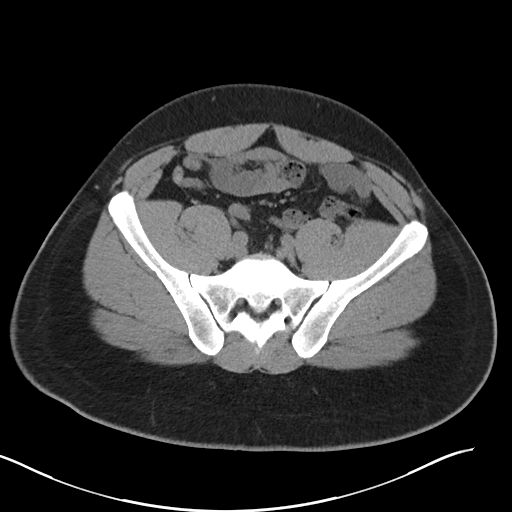
[im 44/101  soft-tissue]
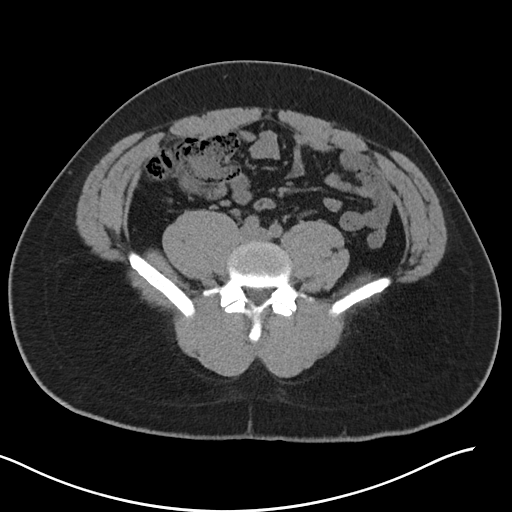
[im 53/101  soft-tissue]
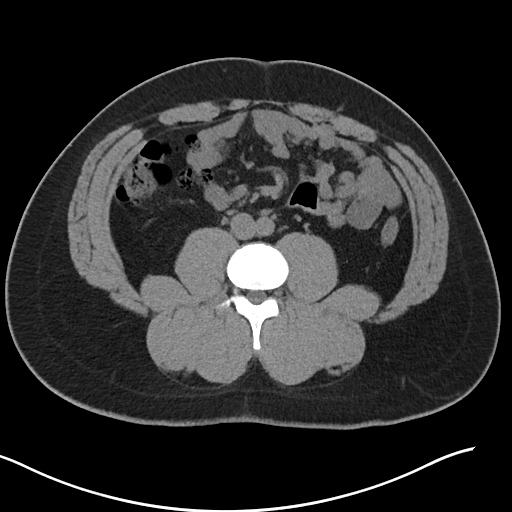
[im 57/101  soft-tissue]
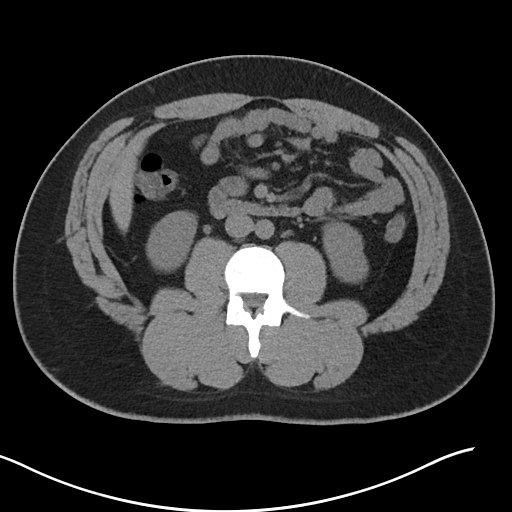
[im 66/101  soft-tissue]
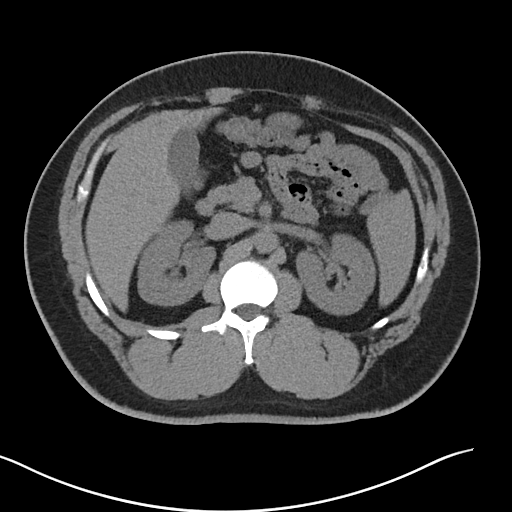
[im 66/101  bone]
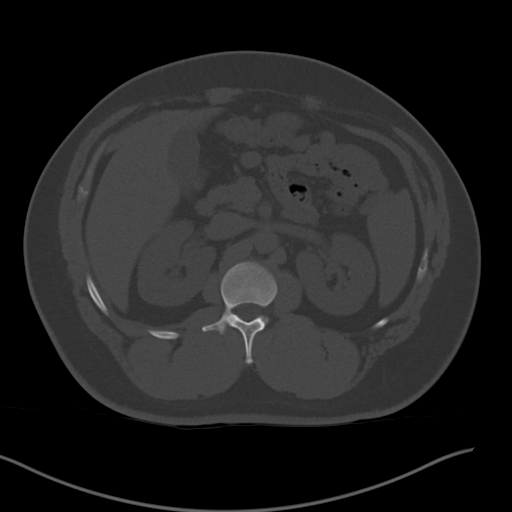
[im 74/101  soft-tissue]
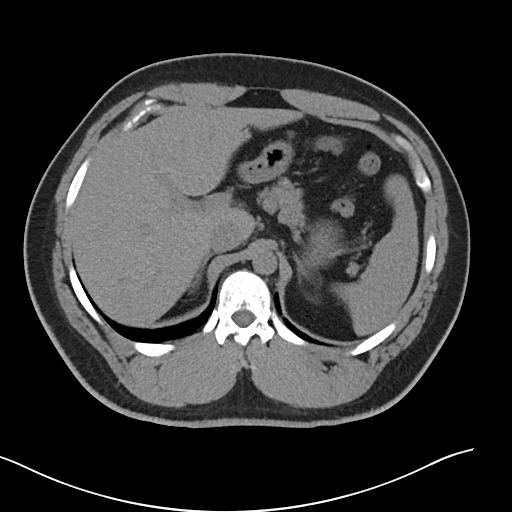
[im 79/101  soft-tissue]
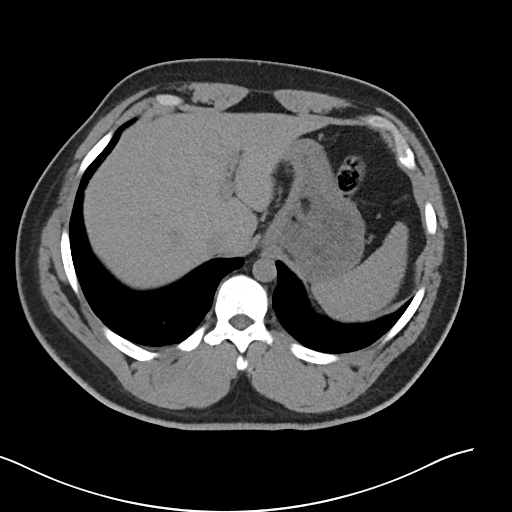
[im 87/101  soft-tissue]
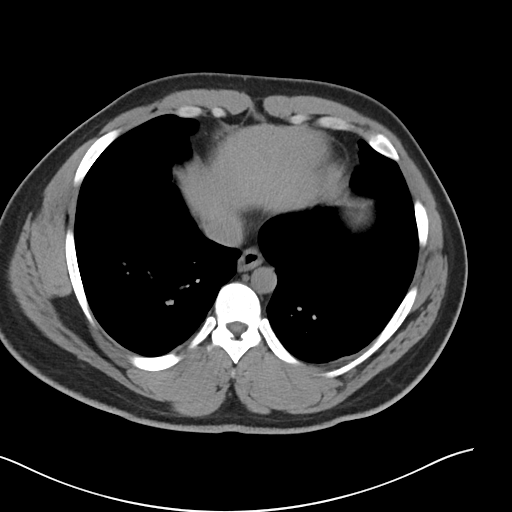
[im 96/101  soft-tissue]
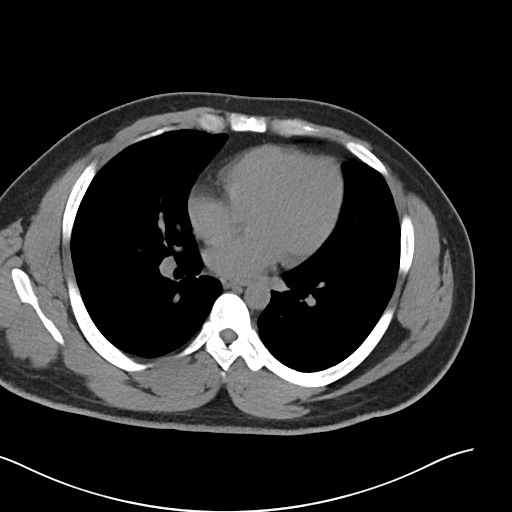

[Series 5: coronal soft tissue · coronal · 0.78mm/px · 3 of 114 slices shown]
[im 38/114  soft-tissue]
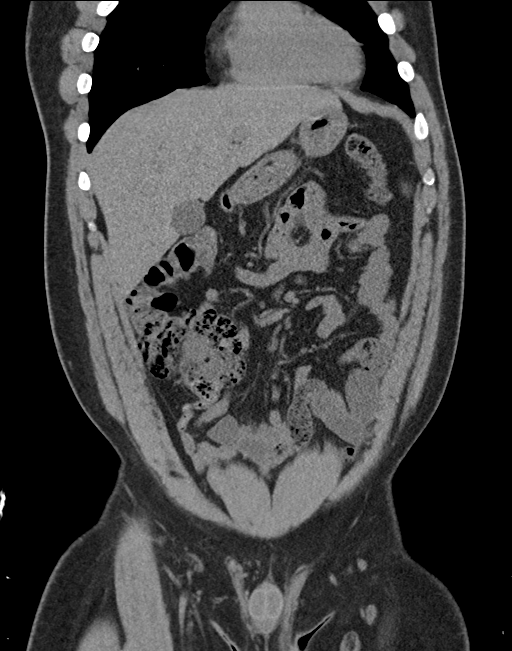
[im 51/114  soft-tissue]
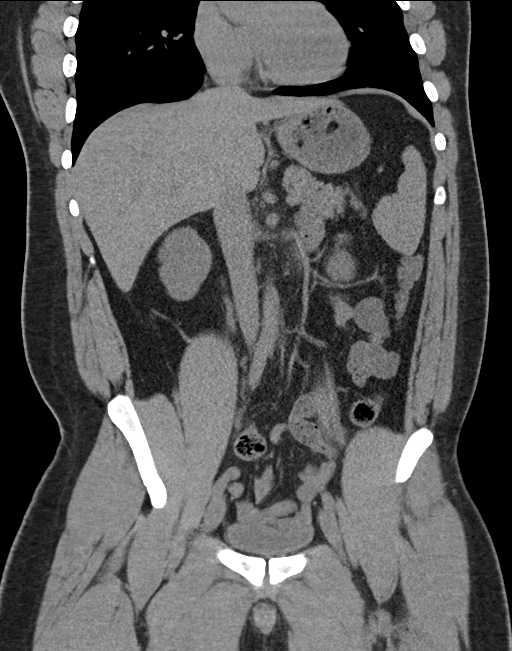
[im 63/114  soft-tissue]
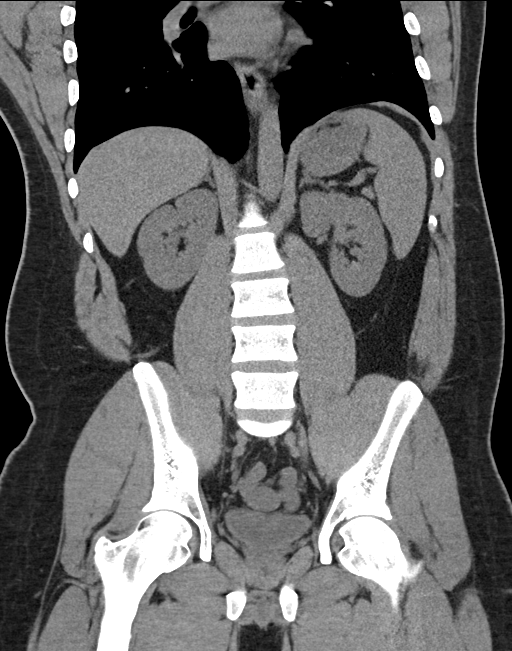

[16 of 46 positions shown; findings below may reference images not displayed]

FINDINGS: Lower chest: No acute abnormality.

Hepatobiliary: No focal liver abnormality is seen. No gallstones,
gallbladder wall thickening, or biliary dilatation.

Pancreas: Unremarkable. No pancreatic ductal dilatation or
surrounding inflammatory changes.

Spleen: Normal in size without focal abnormality.

Adrenals/Urinary Tract: Adrenal glands are unremarkable. 3 mm distal
right ureteral calculus resulting in mild right hydronephrosis. No
other urolithiasis. No renal mass. Normal bladder.

Stomach/Bowel: Stomach is within normal limits. No evidence of bowel
wall thickening, distention, or inflammatory changes. Appendix is
normal.

Vascular/Lymphatic: No significant vascular findings are present. No
enlarged abdominal or pelvic lymph nodes.

Reproductive: Prostate is unremarkable.

Other: No abdominal wall hernia or abnormality. No abdominopelvic
ascites.

Musculoskeletal: No acute osseous abnormality. No aggressive osseous
lesion.
IMPRESSION: 1. A 3 mm distal right ureteral calculus resulting in mild right
hydronephrosis.

## 2023-02-03 ENCOUNTER — Emergency Department (HOSPITAL_COMMUNITY)
Admission: EM | Admit: 2023-02-03 | Discharge: 2023-02-03 | Disposition: A | Discharge status: Home / Self Care | Payer: 59 | Attending: Emergency Medicine | Admitting: Emergency Medicine

## 2023-02-03 ENCOUNTER — Encounter (HOSPITAL_COMMUNITY): Payer: Self-pay

## 2023-02-03 ENCOUNTER — Other Ambulatory Visit: Payer: Self-pay

## 2023-02-03 DIAGNOSIS — J45909 Unspecified asthma, uncomplicated: Secondary | ICD-10-CM | POA: Insufficient documentation

## 2023-02-03 DIAGNOSIS — R519 Headache, unspecified: Secondary | ICD-10-CM | POA: Diagnosis present

## 2023-02-03 DIAGNOSIS — J0110 Acute frontal sinusitis, unspecified: Secondary | ICD-10-CM | POA: Insufficient documentation

## 2023-02-03 DIAGNOSIS — Z20822 Contact with and (suspected) exposure to covid-19: Secondary | ICD-10-CM | POA: Diagnosis not present

## 2023-02-03 LAB — RESP PANEL BY RT-PCR (RSV, FLU A&B, COVID)  RVPGX2
Influenza A by PCR: NEGATIVE
Influenza B by PCR: NEGATIVE
Resp Syncytial Virus by PCR: NEGATIVE
SARS Coronavirus 2 by RT PCR: NEGATIVE

## 2023-02-03 LAB — COMPREHENSIVE METABOLIC PANEL
ALT: 37 U/L (ref 0–44)
AST: 23 U/L (ref 15–41)
Albumin: 3.8 g/dL (ref 3.5–5.0)
Alkaline Phosphatase: 71 U/L (ref 38–126)
Anion gap: 9 (ref 5–15)
BUN: 9 mg/dL (ref 6–20)
CO2: 26 mmol/L (ref 22–32)
Calcium: 9.3 mg/dL (ref 8.9–10.3)
Chloride: 105 mmol/L (ref 98–111)
Creatinine, Ser: 1.04 mg/dL (ref 0.61–1.24)
GFR, Estimated: >60 mL/min (ref >60)
Glucose, Bld: 75 mg/dL (ref 70–99)
Potassium: 3.7 mmol/L (ref 3.5–5.1)
Sodium: 140 mmol/L (ref 135–145)
Total Bilirubin: 1 mg/dL (ref 0.3–1.2)
Total Protein: 8.2 g/dL — ABNORMAL HIGH (ref 6.5–8.1)

## 2023-02-03 LAB — CBC
HCT: 43.5 % (ref 39.0–52.0)
Hemoglobin: 15.9 g/dL (ref 13.0–17.0)
MCH: 33.1 pg (ref 26.0–34.0)
MCHC: 36.6 g/dL — ABNORMAL HIGH (ref 30.0–36.0)
MCV: 90.4 fL (ref 80.0–100.0)
Platelets: 245 10*3/uL (ref 150–400)
RBC: 4.81 MIL/uL (ref 4.22–5.81)
RDW: 11.8 % (ref 11.5–15.5)
WBC: 3.8 10*3/uL — ABNORMAL LOW (ref 4.0–10.5)
nRBC: 0 % (ref 0.0–0.2)

## 2023-02-03 MED ORDER — ACETAMINOPHEN 325 MG PO TABS
650.0000 mg | ORAL_TABLET | Freq: Once | ORAL | Status: AC
Start: 2023-02-03 — End: 2023-02-03
  Administered 2023-02-03: 650 mg via ORAL
  Filled 2023-02-03: qty 2

## 2023-02-03 MED ORDER — AMOXICILLIN-POT CLAVULANATE 875-125 MG PO TABS: 1.0000 | ORAL_TABLET | Freq: Two times a day (BID) | ORAL | 0 refills | Status: DC

## 2023-02-03 MED ORDER — ONDANSETRON 4 MG PO TBDP
4.0000 mg | ORAL_TABLET | Freq: Once | ORAL | Status: AC
  Administered 2023-02-03: 4 mg via ORAL
  Filled 2023-02-03: qty 1

## 2023-02-03 MED ORDER — ONDANSETRON HCL 4 MG PO TABS: 4.0000 mg | ORAL_TABLET | Freq: Four times a day (QID) | ORAL | 0 refills | Status: DC

## 2023-02-03 NOTE — ED Triage Notes (Signed)
Patient reports migraine x 3 days, nausea and vomiting x 2 days, and sore throat starting today. Denies hitting head. Denies hx of migraine. Denies dizziness and blurred vision. Took ibuprofen without relief.

## 2023-02-03 NOTE — Discharge Instructions (Signed)
Please follow-up with the primary care provider of attached your for you today regards recent ER visit.  Today your exam shows you most likely of a sinus infection and most time this is a viral infection or a cold the last 7 to 10 days.  As we discussed I will print out a prescription for antibiotics.  If your symptoms last longer than 10 days then please fill this prescription.  I have also prescribed for you Zofran to take at home to help with your nausea and vomiting.  Please take Tylenol or ibuprofen every 6 hours as needed for pain and drink plenty of fluids.  Symptoms change or worsen please return to ER.

## 2023-02-03 NOTE — ED Provider Notes (Signed)
[place] EMERGENCY DEPARTMENT AT [facility] Provider Note   CSN: [id number] Arrival date & time: 02/03/23  1342     History  Chief Complaint  Patient presents with   Migraine    Dustin Christian is a 27 y.o. male history of asthma presented with 3 days of URI-like symptoms including congestion, headache, rhinorrhea, nausea/vomiting.  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  Patient dates that his headache is between his eyes on his forehead and is a throbbing sensation.  Patient denies the headache being sudden and maximal in onset or any neurologic symptoms.  Patient states she has had 2 episodes of nonbloody emesis.  Patient denies sick contacts.  Patient does note he had a fever yesterday of 49 F which got better after Tylenol.  Patient denies chest pain, shortness of breath, neck stiffness or altered mental status, abdominal pain, dysuria  Home Medications Prior to Admission medications   Medication Sig Start Date End Date Taking? Authorizing Provider  amoxicillin-clavulanate (AUGMENTIN) 875-125 MG tablet Take 1 tablet by mouth every 12 (twelve) hours. 02/03/23  Yes Dustin Christian  ondansetron (ZOFRAN) 4 MG tablet Take 1 tablet (4 mg total) by mouth every 6 (six) hours. 02/03/23  Yes Dustin Christian  acetaminophen (TYLENOL) 500 MG tablet Take 500 mg by mouth every 6 (six) hours as needed for mild pain.    Provider, Historical, Christian  amoxicillin-clavulanate (AUGMENTIN) 875-125 MG tablet Take 1 tablet by mouth every 12 (twelve) hours. Patient not taking: Reported on 08/10/2021 03/10/21   Dustin Christian  cephALEXin (KEFLEX) 500 MG capsule 2 caps po bid x 7 days Patient not taking: Reported on 08/10/2021 04/03/21   Dustin Christian  doxycycline (VIBRAMYCIN) 100 MG capsule Take 1 capsule (100 mg total) by mouth 2 (two) times daily. Patient not taking: Reported on  08/10/2021 03/10/21   Dustin Christian, Dustin Mast Christian, Christian  ibuprofen (ADVIL) 200 MG tablet Take 200 mg by mouth every 6 (six) hours as needed for mild pain or headache.    Provider, Historical, Christian  naproxen (NAPROSYN) 500 MG tablet Take 1 tablet (500 mg total) by mouth 2 (two) times daily as needed for moderate pain. Patient not taking: Reported on 08/10/2021 03/10/21   Dustin Christian, Dustin Christian, Christian  ondansetron (ZOFRAN) 4 MG tablet Take 1 tablet (4 mg total) by mouth every 6 (six) hours. 08/10/21   Dustin Christian  oxyCODONE-acetaminophen (PERCOCET/ROXICET) 5-325 MG tablet Take 1 tablet by mouth every 6 (six) hours as needed for severe pain. 08/10/21   Dustin Christian  tamsulosin (FLOMAX) 0.4 MG CAPS capsule Take 1 capsule (0.4 mg total) by mouth daily. 08/10/21   Dustin Christian  Tetrahydrozoline HCl (VISINE OP) Apply 1 drop to eye 2 (two) times daily as needed (dry eyes).    Provider, Historical, Christian      Allergies    Dustin Christian    Review of Systems   Review of Systems  Physical Exam Updated Vital Signs BP 135/86 (BP Location: Left Arm)   Pulse 83   Temp 98.4 F (36.9 C) (Oral)   Resp 18   SpO2 96%  Physical Exam Constitutional:      General: He is not in acute distress.    Comments: Resting comfortably in the room  HENT:     Head: Normocephalic and atraumatic.  Jaw: There is normal jaw occlusion.     Salivary Glands: Right salivary gland is not diffusely enlarged or tender. Left salivary gland is not diffusely enlarged or tender.     Comments: No facial swelling Tenderness to frontal sinuses and headache gets worse when leaning forward    Right Ear: Hearing, tympanic membrane, ear canal and external ear normal.     Left Ear: Hearing, tympanic membrane, ear canal and external ear normal.     Nose: Nose normal.     Mouth/Throat:     Lips: Pink.     Mouth: Mucous membranes are moist.     Pharynx: Oropharynx is clear. Uvula midline.     Comments: No  oral floor swelling Tolerating secretions No purulent drainage no No PTA noted No muffled voice noted Eyes:     Extraocular Movements: Extraocular movements intact.     Conjunctiva/sclera: Conjunctivae normal.     Pupils: Pupils are equal, round, and reactive to light.  Neck:     Comments: No neck swelling Cardiovascular:     Rate and Rhythm: Normal rate and regular rhythm.     Pulses: Normal pulses.     Heart sounds: Normal heart sounds.  Pulmonary:     Effort: Pulmonary effort is normal. No respiratory distress.     Breath sounds: Normal breath sounds.  Musculoskeletal:     Cervical back: Normal range of motion and neck supple. No rigidity or tenderness.  Neurological:     Mental Status: He is alert and oriented to person, place, and time.     Sensory: Sensation is intact.     Motor: Motor function is intact.     Coordination: Coordination is intact.     Gait: Gait is intact.     Comments: Vision grossly intact Cranial nerves III through XII intact     ED Results / Procedures / Treatments   Labs (all labs ordered are listed, but only abnormal results are displayed) Labs Reviewed  COMPREHENSIVE METABOLIC PANEL - Abnormal; Notable for the following components:      Result Value   Total Protein 8.2 (*)    All other components within normal limits  CBC - Abnormal; Notable for the following components:   WBC 3.8 (*)    MCHC 36.6 (*)    All other components within normal limits  RESP PANEL BY RT-PCR (RSV, FLU A&B, COVID)  [id number]    EKG None  Radiology No results found.  Procedures Procedures    Medications Ordered in ED Medications  ondansetron (ZOFRAN-ODT) disintegrating tablet 4 mg (4 mg Oral Given 02/03/23 1412)    ED Course/ Medical Decision Making/ A&P                                 Medical Decision Making Amount and/or Complexity of Data Reviewed Labs: ordered.  Risk Prescription drug management.   Dustin Christian 27 y.o. presented today for  URI like symptoms. Working DDx that I considered at this time includes, but not limited to, viral illness, sinusitis, pharyngitis, mono, electrolyte abnormality, AOM.  Christian/o DDx: pharyngitis, mono, electrolyte abnormality, AOM: these diagnoses are not consistent with patient's history, presentation, physical exam, labs/imaging findings.  Review of prior external notes: 08/10/2021 ED  Unique Tests and My Interpretation:  Respiratory Panel: Negative CBC: Unremarkable CMP: Unremarkable  Discussion with Independent Historian: None  Discussion of Management of Tests: None  Risk: Medium: prescription drug management  Risk Stratification Score: None  Plan: On exam patient was no acute distress stable vitals.  Patient has been able to drink a whole bottle of ginger ale after being given Zofran from triage and states he feels better.  Will order patient Tylenol.  Patient's labs are unremarkable however patient states that he is a headache worse frontal sinuses are and feels a throbbing headache without red flag features.  As the patient leaned forward he states this makes the headache worse.  Has been 3 days and so I suspect patient has a viral sinusitis causing his symptoms.  Will prescribe Zofran and encourage over-the-counter anti-inflammatories along with primary care follow-up and plenty fluids.  I discussed with the patient we agreed to print out a prescription for Augmentin and if patient is still having symptoms after 10 days from onset he is to fill the prescription as he could be bacterial at that point.  Patient given work note at his request.  Patient was given return precautions.patient stable for discharge at this time.  Patient verbalized understanding of plan.  This chart was dictated using voice recognition software.  Despite best efforts to proofread,  errors can occur which can change the documentation meaning.         Final Clinical Impression(s) / ED Diagnoses Final  diagnoses:  Acute non-recurrent frontal sinusitis    Rx / DC Orders ED Discharge Orders          Ordered    amoxicillin-clavulanate (AUGMENTIN) 875-125 MG tablet  Every 12 hours        02/03/23 2059    ondansetron (ZOFRAN) 4 MG tablet  Every 6 hours        02/03/23 2059              Remi Deter 02/03/23 2211    Glyn Ade, Christian 02/04/23 1505

## 2023-02-07 NOTE — Plan of Care (Signed)
[facility] Tonsillectomy/Adenoidectomy, Postoperative PEDS care plan entered in error.

## 2023-03-28 ENCOUNTER — Emergency Department (HOSPITAL_COMMUNITY): Payer: 59

## 2023-03-28 ENCOUNTER — Emergency Department (HOSPITAL_COMMUNITY)
Admission: EM | Admit: 2023-03-28 | Discharge: 2023-03-28 | Disposition: A | Discharge status: Home / Self Care | Payer: 59 | Attending: Emergency Medicine | Admitting: Emergency Medicine

## 2023-03-28 ENCOUNTER — Encounter (HOSPITAL_COMMUNITY): Payer: Self-pay

## 2023-03-28 ENCOUNTER — Other Ambulatory Visit: Payer: Self-pay

## 2023-03-28 DIAGNOSIS — J069 Acute upper respiratory infection, unspecified: Secondary | ICD-10-CM | POA: Insufficient documentation

## 2023-03-28 DIAGNOSIS — J45909 Unspecified asthma, uncomplicated: Secondary | ICD-10-CM | POA: Insufficient documentation

## 2023-03-28 DIAGNOSIS — Z7951 Long term (current) use of inhaled steroids: Secondary | ICD-10-CM | POA: Insufficient documentation

## 2023-03-28 DIAGNOSIS — J3489 Other specified disorders of nose and nasal sinuses: Secondary | ICD-10-CM | POA: Diagnosis not present

## 2023-03-28 DIAGNOSIS — J029 Acute pharyngitis, unspecified: Secondary | ICD-10-CM | POA: Diagnosis present

## 2023-03-28 DIAGNOSIS — Z79899 Other long term (current) drug therapy: Secondary | ICD-10-CM | POA: Diagnosis not present

## 2023-03-28 DIAGNOSIS — Z1152 Encounter for screening for COVID-19: Secondary | ICD-10-CM | POA: Diagnosis not present

## 2023-03-28 LAB — CBC
HCT: 43.3 % (ref 39.0–52.0)
Hemoglobin: 15.3 g/dL (ref 13.0–17.0)
MCH: 32.1 pg (ref 26.0–34.0)
MCHC: 35.3 g/dL (ref 30.0–36.0)
MCV: 91 fL (ref 80.0–100.0)
Platelets: 283 10*3/uL (ref 150–400)
RBC: 4.76 MIL/uL (ref 4.22–5.81)
RDW: 11.5 % (ref 11.5–15.5)
WBC: 7.9 10*3/uL (ref 4.0–10.5)
nRBC: 0 % (ref 0.0–0.2)

## 2023-03-28 LAB — BASIC METABOLIC PANEL
Anion gap: 10 (ref 5–15)
BUN: 6 mg/dL (ref 6–20)
CO2: 24 mmol/L (ref 22–32)
Calcium: 9.1 mg/dL (ref 8.9–10.3)
Chloride: 105 mmol/L (ref 98–111)
Creatinine, Ser: 1.05 mg/dL (ref 0.61–1.24)
GFR, Estimated: >60 mL/min (ref >60)
Glucose, Bld: 90 mg/dL (ref 70–99)
Potassium: 3.5 mmol/L (ref 3.5–5.1)
Sodium: 139 mmol/L (ref 135–145)

## 2023-03-28 LAB — RESP PANEL BY RT-PCR (RSV, FLU A&B, COVID)  RVPGX2
Influenza A by PCR: NEGATIVE
Influenza B by PCR: NEGATIVE
Resp Syncytial Virus by PCR: NEGATIVE
SARS Coronavirus 2 by RT PCR: NEGATIVE

## 2023-03-28 LAB — GROUP A STREP BY PCR: Group A Strep by PCR: NOT DETECTED

## 2023-03-28 MED ORDER — ALBUTEROL SULFATE HFA 108 (90 BASE) MCG/ACT IN AERS
2.0000 | INHALATION_SPRAY | RESPIRATORY_TRACT | Status: DC | PRN
  Administered 2023-03-28: 2 via RESPIRATORY_TRACT
  Filled 2023-03-28: qty 6.7

## 2023-03-28 MED ORDER — IBUPROFEN 800 MG PO TABS
800.0000 mg | ORAL_TABLET | Freq: Once | ORAL | Status: AC
  Administered 2023-03-28: 800 mg via ORAL
  Filled 2023-03-28: qty 1

## 2023-03-28 MED ORDER — PREDNISONE 20 MG PO TABS: 40.0000 mg | ORAL_TABLET | Freq: Every day | ORAL | 0 refills | Status: DC

## 2023-03-28 MED ORDER — AZITHROMYCIN 250 MG PO TABS: 250.0000 mg | ORAL_TABLET | Freq: Every day | ORAL | 0 refills | Status: DC

## 2023-03-28 NOTE — ED Triage Notes (Signed)
Pt c/o sob, coughing dark green mucus, fever, sore throat, headache, and heat flashes. Pt states the first symptom started last Wednesday. Pt went to a hospital in [place] this past Friday. The pt was prescribed amoxicillin and started medication on Friday. Pt states the symptoms just got worse.

## 2023-03-28 NOTE — Discharge Instructions (Signed)
Use your inhaler every 4-6 hours as needed.  You can still use Tylenol as needed for fever.  You were given a prescription for a new antibiotic that should cover for mycoplasma as well as steroids to help with your asthma.  If your symptoms start worsening over the next 3 to 4 days return to the emergency room.  I would expect over the next 3 to 4 days you should start improving.

## 2023-03-28 NOTE — ED Provider Notes (Addendum)
[place] EMERGENCY DEPARTMENT AT [facility] Provider Note   CSN: [id number] Arrival date & time: 03/28/23  1334     History  Chief Complaint  Patient presents with   Sore Throat    Dustin Christian is a 27 y.o. male.  Patient is a 27 year old male with a history of asthma presenting today with persistent URI symptoms.  Patient reports 6 days prior to arrival he developed headache, vomiting, cough and feeling generally unwell with malaise and myalgias.  His symptoms have persisted since that time.  He no longer has the vomiting but continues to have a deep cough, headache and intermittent fevers.  He was seen in a facility over near [place] where he was traveling for business and he reports they told him that he had an upper respiratory infection and gave him amoxicillin but reports they did not do any blood work or imaging.  He has taken the amoxicillin and had a note to return to work today however overnight he had a temperature of 101.  He is continued to have a productive cough, nasal congestion, intermittent wheezing and shortness of breath requiring him to use his inhaler more frequently.  He did have 1 episode of diarrhea yesterday but denies persistent diarrhea.  No abdominal pain.  He denies significant shortness of breath.  The history is provided by the patient.  Sore Throat       Home Medications Prior to Admission medications   Medication Sig Start Date End Date Taking? Authorizing Provider  azithromycin (ZITHROMAX) 250 MG tablet Take 1 tablet (250 mg total) by mouth daily. Take first 2 tablets together, then 1 every day until finished. 03/28/23  Yes Aylen Rambert, Alphonzo Lemmings, MD  predniSONE (DELTASONE) 20 MG tablet Take 2 tablets (40 mg total) by mouth daily. 03/28/23  Yes Gwyneth Sprout, MD  acetaminophen (TYLENOL) 500 MG tablet Take 500 mg by mouth every 6 (six) hours as needed for mild pain.    [provider]  cephALEXin (KEFLEX) 500 MG capsule 2 caps po  bid x 7 days Patient not taking: Reported on 08/10/2021 04/03/21   Bethann Berkshire, MD  ibuprofen (ADVIL) 200 MG tablet Take 200 mg by mouth every 6 (six) hours as needed for mild pain or headache.    [provider]  naproxen (NAPROSYN) 500 MG tablet Take 1 tablet (500 mg total) by mouth 2 (two) times daily as needed for moderate pain. Patient not taking: Reported on 08/10/2021 03/10/21   Petrucelli, Samantha R, PA-C  ondansetron (ZOFRAN) 4 MG tablet Take 1 tablet (4 mg total) by mouth every 6 (six) hours. 08/10/21   Janell Quiet, PA-C  ondansetron (ZOFRAN) 4 MG tablet Take 1 tablet (4 mg total) by mouth every 6 (six) hours. 02/03/23   Netta Corrigan, PA-C  oxyCODONE-acetaminophen (PERCOCET/ROXICET) 5-325 MG tablet Take 1 tablet by mouth every 6 (six) hours as needed for severe pain. 08/10/21   Janell Quiet, PA-C  tamsulosin (FLOMAX) 0.4 MG CAPS capsule Take 1 capsule (0.4 mg total) by mouth daily. 08/10/21   Janell Quiet, PA-C  Tetrahydrozoline HCl (VISINE OP) Apply 1 drop to eye 2 (two) times daily as needed (dry eyes).    [provider]      Allergies    Mushroom extract complex (do not select)    Review of Systems   Review of Systems  Physical Exam Updated Vital Signs BP 128/83   Pulse 99   Temp 98.3 F (36.8 C) (Oral)  Resp 16   Ht 5\' 8"  (1.727 m)   Wt 93 kg   SpO2 97%   BMI 31.17 kg/m  Physical Exam Vitals and nursing note reviewed.  Constitutional:      General: He is not in acute distress.    Appearance: He is well-developed.  HENT:     Head: Normocephalic and atraumatic.     Nose: Congestion and rhinorrhea present.     Mouth/Throat:     Pharynx: Posterior oropharyngeal erythema present. No oropharyngeal exudate.     Comments: No posterior pharyngeal swelling Eyes:     Conjunctiva/sclera: Conjunctivae normal.     Pupils: Pupils are equal, round, and reactive to light.  Cardiovascular:     Rate and Rhythm: Normal rate and regular  rhythm.     Heart sounds: No murmur heard. Pulmonary:     Effort: Pulmonary effort is normal. No respiratory distress.     Breath sounds: Decreased air movement present. Wheezing present. No rales.  Abdominal:     General: There is no distension.     Palpations: Abdomen is soft.     Tenderness: There is no abdominal tenderness. There is no guarding or rebound.  Musculoskeletal:        General: No tenderness. Normal range of motion.     Cervical back: Normal range of motion and neck supple.  Skin:    General: Skin is warm and dry.     Findings: No erythema or rash.  Neurological:     Mental Status: He is alert and oriented to person, place, and time.  Psychiatric:        Behavior: Behavior normal.     ED Results / Procedures / Treatments   Labs (all labs ordered are listed, but only abnormal results are displayed) Labs Reviewed  RESP PANEL BY RT-PCR (RSV, FLU A&B, COVID)  [id number]  GROUP A STREP BY PCR  CBC  BASIC METABOLIC PANEL    EKG None  Radiology DG Chest 2 View  Result Date: 03/28/2023 CLINICAL DATA:  Cough EXAM: CHEST - 2 VIEW COMPARISON:  Chest radiograph 03/09/2021 FINDINGS: The heart size and mediastinal contours are within normal limits. Both lungs are clear. The visualized skeletal structures are unremarkable. IMPRESSION: No active cardiopulmonary disease. Electronically Signed   By: Annia Belt M.D.   On: 03/28/2023 15:29    Procedures Procedures    Medications Ordered in ED Medications  albuterol (VENTOLIN HFA) 108 (90 Base) MCG/ACT inhaler 2 puff (has no administration in time range)  ibuprofen (ADVIL) tablet 800 mg (800 mg Oral Given 03/28/23 1428)    ED Course/ Medical Decision Making/ A&P                                 Medical Decision Making Amount and/or Complexity of Data Reviewed External Data Reviewed: notes. Labs: ordered. Decision-making details documented in ED Course. Radiology: ordered and independent interpretation performed.  Decision-making details documented in ED Course.  Risk Prescription drug management.   Pt with symptoms consistent with viral URI vs mycoplasm now sick for 6 days.  Already completed amoxicillin without improvement in fever or symptoms.  Well appearing here.  No signs of breathing difficulty but patient does have scant wheezing and mild decreased breath sounds and is having to use his inhaler more.  No signs of pharyngitis, otitis or abnormal abdominal findings.   I have independently visualized and interpreted pt's images today. CXR  wnl and I independently interpreted patient's labs and CBC, BMP, strep and viral swab is all negative.  Will give patient azithromycin to cover for mycoplasma and prednisone.  He was given a new inhaler as he also appears to be having a mild asthma exacerbation related with this URI.  Pt to return with any further problems.         Final Clinical Impression(s) / ED Diagnoses Final diagnoses:  Viral upper respiratory tract infection    Rx / DC Orders ED Discharge Orders          Ordered    azithromycin (ZITHROMAX) 250 MG tablet  Daily        03/28/23 1644    predniSONE (DELTASONE) 20 MG tablet  Daily        03/28/23 1644              Gwyneth Sprout, MD 03/28/23 1649    Gwyneth Sprout, MD 03/28/23 1650

## 2023-03-28 NOTE — ED Provider Triage Note (Signed)
Emergency Medicine Provider Triage Evaluation Note  Dustin Christian , a 27 y.o. male  was evaluated in triage.  Pt complains of sore throat, cough and congestion. Started a week ago. Endorses fever. Cough with yellow and green sputum.   Review of Systems  Positive: See above Negative: See abov  Physical Exam  BP 128/83   Pulse 99   Temp 98.3 F (36.8 C) (Oral)   Resp 16   Ht 5\' 8"  (1.727 m)   Wt 93 kg   SpO2 97%   BMI 31.17 kg/m  Gen:   Awake, no distress   Resp:  Normal effort  MSK:   Moves extremities without difficulty  Other:    Medical Decision Making  Medically screening exam initiated at 2:18 PM.  Appropriate orders placed.  [name] was informed that the remainder of the evaluation will be completed by another provider, this initial triage assessment does not replace that evaluation, and the importance of remaining in the ED until their evaluation is complete.  Work up started   Gareth Eagle, PA-C 03/28/23 1423

## 2023-10-01 ENCOUNTER — Emergency Department (HOSPITAL_COMMUNITY)
Admission: EM | Admit: 2023-10-01 | Discharge: 2023-10-01 | Disposition: A | Discharge status: Home / Self Care | Attending: Emergency Medicine | Admitting: Emergency Medicine

## 2023-10-01 ENCOUNTER — Emergency Department (HOSPITAL_COMMUNITY)

## 2023-10-01 ENCOUNTER — Encounter (HOSPITAL_COMMUNITY): Payer: Self-pay | Admitting: *Deleted

## 2023-10-01 ENCOUNTER — Other Ambulatory Visit: Payer: Self-pay

## 2023-10-01 DIAGNOSIS — J45909 Unspecified asthma, uncomplicated: Secondary | ICD-10-CM | POA: Diagnosis not present

## 2023-10-01 DIAGNOSIS — N200 Calculus of kidney: Secondary | ICD-10-CM

## 2023-10-01 DIAGNOSIS — E876 Hypokalemia: Secondary | ICD-10-CM | POA: Diagnosis not present

## 2023-10-01 DIAGNOSIS — N132 Hydronephrosis with renal and ureteral calculous obstruction: Secondary | ICD-10-CM | POA: Insufficient documentation

## 2023-10-01 DIAGNOSIS — R109 Unspecified abdominal pain: Secondary | ICD-10-CM | POA: Diagnosis present

## 2023-10-01 LAB — COMPREHENSIVE METABOLIC PANEL WITH GFR
ALT: 37 U/L (ref 0–44)
AST: 31 U/L (ref 15–41)
Albumin: 3.8 g/dL (ref 3.5–5.0)
Alkaline Phosphatase: 62 U/L (ref 38–126)
Anion gap: 11 (ref 5–15)
BUN: 7 mg/dL (ref 6–20)
CO2: 20 mmol/L — ABNORMAL LOW (ref 22–32)
Calcium: 9.3 mg/dL (ref 8.9–10.3)
Chloride: 108 mmol/L (ref 98–111)
Creatinine, Ser: 1.21 mg/dL (ref 0.61–1.24)
GFR, Estimated: >60 mL/min (ref >60)
Glucose, Bld: 118 mg/dL — ABNORMAL HIGH (ref 70–99)
Potassium: 3.3 mmol/L — ABNORMAL LOW (ref 3.5–5.1)
Sodium: 139 mmol/L (ref 135–145)
Total Bilirubin: 0.9 mg/dL (ref 0.0–1.2)
Total Protein: 8 g/dL (ref 6.5–8.1)

## 2023-10-01 LAB — CBC
HCT: 45.5 % (ref 39.0–52.0)
Hemoglobin: 16.5 g/dL (ref 13.0–17.0)
MCH: 32.4 pg (ref 26.0–34.0)
MCHC: 36.3 g/dL — ABNORMAL HIGH (ref 30.0–36.0)
MCV: 89.2 fL (ref 80.0–100.0)
Platelets: 249 10*3/uL (ref 150–400)
RBC: 5.1 MIL/uL (ref 4.22–5.81)
RDW: 11.5 % (ref 11.5–15.5)
WBC: 7.3 10*3/uL (ref 4.0–10.5)
nRBC: 0 % (ref 0.0–0.2)

## 2023-10-01 LAB — URINALYSIS, ROUTINE W REFLEX MICROSCOPIC
Bacteria, UA: NONE SEEN
Glucose, UA: NEGATIVE mg/dL
Ketones, ur: NEGATIVE mg/dL
Leukocytes,Ua: NEGATIVE
Nitrite: NEGATIVE
Protein, ur: 100 mg/dL — AB
Specific Gravity, Urine: 1.028 (ref 1.005–1.030)
pH: 8 (ref 5.0–8.0)

## 2023-10-01 LAB — LIPASE, BLOOD: Lipase: 36 U/L (ref 11–51)

## 2023-10-01 MED ORDER — HYDROMORPHONE HCL 1 MG/ML IJ SOLN
1.0000 mg | Freq: Once | INTRAMUSCULAR | Status: AC
  Filled 2023-10-01: qty 1

## 2023-10-01 MED ORDER — HYDROMORPHONE HCL 1 MG/ML IJ SOLN
1.0000 mg | Freq: Once | INTRAMUSCULAR | Status: AC
  Administered 2023-10-01: 1 mg via INTRAVENOUS
  Filled 2023-10-01: qty 1

## 2023-10-01 MED ORDER — OXYCODONE-ACETAMINOPHEN 5-325 MG PO TABS: 1.0000 | ORAL_TABLET | Freq: Four times a day (QID) | ORAL | 0 refills | Status: DC | PRN

## 2023-10-01 MED ORDER — HYDROCODONE-ACETAMINOPHEN 5-325 MG PO TABS
1.0000 | ORAL_TABLET | Freq: Once | ORAL | Status: AC
  Filled 2023-10-01: qty 1

## 2023-10-01 MED ORDER — ONDANSETRON 4 MG PO TBDP
4.0000 mg | ORAL_TABLET | Freq: Once | ORAL | Status: AC
  Filled 2023-10-01: qty 1

## 2023-10-01 MED ORDER — ONDANSETRON HCL 4 MG/2ML IJ SOLN
4.0000 mg | Freq: Once | INTRAMUSCULAR | Status: AC
  Filled 2023-10-01: qty 2

## 2023-10-01 MED ORDER — KETOROLAC TROMETHAMINE 15 MG/ML IJ SOLN
15.0000 mg | Freq: Once | INTRAMUSCULAR | Status: AC
  Filled 2023-10-01: qty 1

## 2023-10-01 MED ORDER — ONDANSETRON HCL 4 MG PO TABS: 4.0000 mg | ORAL_TABLET | Freq: Four times a day (QID) | ORAL | 0 refills | Status: DC

## 2023-10-01 MED ORDER — LACTATED RINGERS IV SOLN: INTRAVENOUS | Status: DC

## 2023-10-01 NOTE — ED Triage Notes (Addendum)
 The pt is writhing in pain in triage he is not talking   lt testicle pain that started 2 hours ago  no injury     hx of kidney stones

## 2023-10-01 NOTE — ED Provider Notes (Signed)
 [place] EMERGENCY DEPARTMENT AT [facility] Provider Note   CSN: [id number] Arrival date & time: 10/01/23  1558     Patient presents with: No chief complaint on file.   Dustin Christian is a 28 y.o. male.   Patient is a 28 year old male with a history of kidney stones, asthma and allergies who is presenting today with sudden onset of abdominal pain that started this morning which has now progressed to involve his left testicle.  He reports it initially started on the left side and then moved to the middle and now is in the left testicle as well.  He feels like he is unable to urinate and only had a small amount come out prior to arrival.  He has also had nausea and vomiting related to the pain but denies any fever.  The history is provided by the patient and medical records.       Prior to Admission medications   Medication Sig Start Date End Date Taking? Authorizing Provider  acetaminophen  (TYLENOL ) 500 MG tablet Take 500 mg by mouth every 6 (six) hours as needed for mild pain.    [provider]  azithromycin  (ZITHROMAX ) 250 MG tablet Take 1 tablet (250 mg total) by mouth daily. Take first 2 tablets together, then 1 every day until finished. 03/28/23   Almond Army, MD  cephALEXin  (KEFLEX ) 500 MG capsule 2 caps po bid x 7 days Patient not taking: Reported on 08/10/2021 04/03/21   Zammit, Joseph, MD  ibuprofen  (ADVIL ) 200 MG tablet Take 200 mg by mouth every 6 (six) hours as needed for mild pain or headache.    [provider]  naproxen  (NAPROSYN ) 500 MG tablet Take 1 tablet (500 mg total) by mouth 2 (two) times daily as needed for moderate pain. Patient not taking: Reported on 08/10/2021 03/10/21   Petrucelli, Samantha R, PA-C  ondansetron  (ZOFRAN ) 4 MG tablet Take 1 tablet (4 mg total) by mouth every 6 (six) hours. 08/10/21   Conklin, Erica R, PA-C  ondansetron  (ZOFRAN ) 4 MG tablet Take 1 tablet (4 mg total) by mouth every 6 (six) hours. 02/03/23    Denese Finn, PA-C  oxyCODONE -acetaminophen  (PERCOCET/ROXICET) 5-325 MG tablet Take 1 tablet by mouth every 6 (six) hours as needed for severe pain. 08/10/21   Conklin, Erica R, PA-C  predniSONE  (DELTASONE ) 20 MG tablet Take 2 tablets (40 mg total) by mouth daily. 03/28/23   Almond Army, MD  tamsulosin  (FLOMAX ) 0.4 MG CAPS capsule Take 1 capsule (0.4 mg total) by mouth daily. 08/10/21   Conklin, Erica R, PA-C  Tetrahydrozoline HCl (VISINE OP) Apply 1 drop to eye 2 (two) times daily as needed (dry eyes).    [provider]    Allergies: Mushroom extract complex (obsolete)    Review of Systems  Updated Vital Signs BP 137/75   Pulse (!) 105   Temp 98.1 F (36.7 C) (Oral)   Resp 20   Ht 5' 8 (1.727 m)   Wt 93 kg   SpO2 99%   BMI 31.17 kg/m   Physical Exam Vitals and nursing note reviewed.  Constitutional:      General: He is not in acute distress.    Appearance: He is well-developed.  HENT:     Head: Normocephalic and atraumatic.   Eyes:     Conjunctiva/sclera: Conjunctivae normal.     Pupils: Pupils are equal, round, and reactive to light.    Cardiovascular:     Rate and Rhythm: Normal  rate and regular rhythm.     Heart sounds: No murmur heard. Pulmonary:     Effort: Pulmonary effort is normal. No respiratory distress.     Breath sounds: Normal breath sounds. No wheezing or rales.  Abdominal:     General: There is no distension.     Palpations: Abdomen is soft.     Tenderness: There is abdominal tenderness in the suprapubic area. There is left CVA tenderness. There is no guarding or rebound.  Genitourinary:    Comments: Mild tenderness with palpation of the left testicle, normal scrotum, no abnormal testicular lie.  Minimal inguinal tenderness.  Musculoskeletal:        General: No tenderness. Normal range of motion.     Cervical back: Normal range of motion and neck supple.   Skin:    General: Skin is warm and dry.     Findings: No erythema or  rash.   Neurological:     Mental Status: He is alert and oriented to person, place, and time.   Psychiatric:        Behavior: Behavior normal.     (all labs ordered are listed, but only abnormal results are displayed) Labs Reviewed  COMPREHENSIVE METABOLIC PANEL WITH GFR - Abnormal; Notable for the following components:      Result Value   Potassium 3.3 (*)    CO2 20 (*)    Glucose, Bld 118 (*)    All other components within normal limits  CBC - Abnormal; Notable for the following components:   MCHC 36.3 (*)    All other components within normal limits  URINALYSIS, ROUTINE W REFLEX MICROSCOPIC - Abnormal; Notable for the following components:   Color, Urine AMBER (*)    APPearance HAZY (*)    Hgb urine dipstick SMALL (*)    Bilirubin Urine SMALL (*)    Protein, ur 100 (*)    All other components within normal limits  LIPASE, BLOOD    EKG: None  Radiology: US  SCROTUM W/DOPPLER Result Date: 10/01/2023 EXAM: ULTRASOUND SCROTUM/TESTICLES WITH DOPPLER FLOW EVALUATION 10/01/2023 09:19:58 PM TECHNIQUE: Duplex ultrasound using B-mode/gray scaled imaging, Doppler spectral analysis and color flow Doppler was obtained of the testicles. COMPARISON: None available. CLINICAL HISTORY: Left testicle pain. FINDINGS: RIGHT: MEASUREMENTS: Right testis measures 4.6 x 3.0 x 3.3 cm. GREY SCALE: The right testicle demonstrates normal homogeneous echotexture without focal lesion. No testicular microlithiasis. DOPPLER EVALUATION: There is normal arterial and venous Doppler flow within the testicle. VARICOCELES: No scrotal varicoceles SCROTAL SAC: No hydrocele. EPIDIDYMIS: No acute abnormality. LEFT: MEASUREMENTS: Left testis measures 4.4 x 2.1 x 2.9 cm. GREY SCALE: The left testicle demonstrates normal homogeneous echotexture without focal lesion. No testicular microlithiasis. DOPPLER EVALUATION: There is normal arterial and venous Doppler flow within the testicle. VARIOCELES: No scrotal varicoceles  SCROTAL SAC: No hydrocele. EPIDIDYMIS: No acute abnormality. IMPRESSION: 1. Normal scrotal ultrasound. 2. No evidence of testicular torsion. Electronically signed by: Zadie Herter MD 10/01/2023 09:26 PM EDT RP Workstation: [id number]   CT Renal Stone Study Result Date: 10/01/2023 CLINICAL DATA:  Abdominal and flank pain EXAM: CT ABDOMEN AND PELVIS WITHOUT CONTRAST TECHNIQUE: Multidetector CT imaging of the abdomen and pelvis was performed following the standard protocol without IV contrast. RADIATION DOSE REDUCTION: This exam was performed according to the departmental dose-optimization program which includes automated exposure control, adjustment of the mA and/or kV according to patient size and/or use of iterative reconstruction technique. COMPARISON:  08/10/2021 FINDINGS: Lower chest: No acute pleural or parenchymal lung  disease. Hepatobiliary: Unremarkable unenhanced appearance of the liver and gallbladder. Pancreas: Unremarkable unenhanced appearance. Spleen: Unremarkable unenhanced appearance. Adrenals/Urinary Tract: Obstructing 3 mm left UVJ calculus reference image 75/3, with mild left-sided hydronephrosis and hydroureter. Left kidney is edematous and mildly enlarged. The right kidney is unremarkable. The adrenals are normal. The bladder is decompressed, limiting its evaluation. Stomach/Bowel: No bowel obstruction or ileus. Normal appendix right lower quadrant. No bowel wall thickening or inflammatory change. Vascular/Lymphatic: No significant vascular findings are present. No enlarged abdominal or pelvic lymph nodes. Reproductive: Prostate is unremarkable. Other: No free fluid or free intraperitoneal gas. No abdominal wall hernia. Musculoskeletal: No acute or destructive bony abnormalities. Reconstructed images demonstrate no additional findings. IMPRESSION: 1. Obstructing 3 mm left UVJ calculus, with left renal edema and mild left-sided hydronephrosis and hydroureter. Electronically Signed   By:  Bobbye Burrow M.D.   On: 10/01/2023 18:40     Procedures   Medications Ordered in the ED  lactated ringers  infusion ( Intravenous New Bag/Given 10/01/23 1711)  HYDROmorphone  (DILAUDID ) injection 1 mg (1 mg Intravenous Given 10/01/23 1707)  ondansetron  (ZOFRAN ) injection 4 mg (4 mg Intravenous Given 10/01/23 1707)  ketorolac  (TORADOL ) 15 MG/ML injection 15 mg (15 mg Intravenous Given 10/01/23 1921)  HYDROmorphone  (DILAUDID ) injection 1 mg (1 mg Intravenous Given 10/01/23 2153)                                    Medical Decision Making Amount and/or Complexity of Data Reviewed Labs: ordered. Decision-making details documented in ED Course. Radiology: ordered and independent interpretation performed. Decision-making details documented in ED Course.  Risk Prescription drug management.   Pt with symptoms consistent with kidney stone.  Denies infectious sx, or GI symptoms.  Low concern for diverticulitis and no risk factors or history suggestive of AAA.  No hx suggestive of GU source (discharge) and otherwise pt is healthy.  Lower suspicion for torsion at this time or UTI/pyelo.  Bedside us  without signs of retention.  Will hydrate, treat pain and ensure no infection with UA, CBC, CMP and will get stone study to further eval.  10:17 PM I independently interpreted patient's labs And lipase within normal limits, CMP without acute findings except for mild hypokalemia of 3.3, CBC within normal limits and UA with small hemoglobin.  I have independently visualized and interpreted pt's images today.  CT today with left-sided hydronephrosis and a 3 mm stone.  Radiology reports the stone is in the UVJ.  Suspect that patient's stone will pass without complication.  On repeat evaluation he was still having pain and given some Toradol .  10:17 PM On repeat exam patient reports that the kidney stone pain has significantly improved but he still having a lot of pain in his left testicle and he feels like this  is different.  Will do an ultrasound to ensure no acute testicular pathology  10:17 PM Ultrasound was negative however on reevaluation patient is starting to have pain again and suspect the stone is moving.  He was given further pain control.      Final diagnoses:  None    ED Discharge Orders     None          Almond Army, MD 10/01/23 2311

## 2023-10-01 NOTE — Discharge Instructions (Addendum)
 Blood work looked normal today.  No signs of infection.  The CAT scan showed another kidney stone on the left this time and it was 3mm.  It should pass on it's own hopefully soon.  Return if you start having fever or persistent vomiting.

## 2023-10-01 NOTE — ED Notes (Signed)
 Pt reports he has called his father, who is on his way and will be discharge ride home

## 2023-10-01 NOTE — ED Notes (Signed)
 Pt to Dustin Christian.

## 2023-10-01 NOTE — ED Triage Notes (Signed)
 Much difficulty getting anything from the pt  he thinks he has torsion

## 2024-03-26 ENCOUNTER — Encounter (HOSPITAL_COMMUNITY): Payer: Self-pay

## 2024-03-26 ENCOUNTER — Ambulatory Visit (INDEPENDENT_AMBULATORY_CARE_PROVIDER_SITE_OTHER)

## 2024-03-26 ENCOUNTER — Ambulatory Visit (HOSPITAL_COMMUNITY)

## 2024-03-26 ENCOUNTER — Ambulatory Visit (HOSPITAL_COMMUNITY)
Admission: RE | Admit: 2024-03-26 | Discharge: 2024-03-26 | Disposition: A | Discharge status: Home / Self Care | Source: Ambulatory Visit | Attending: Family Medicine | Admitting: Family Medicine

## 2024-03-26 VITALS — BP 115/76 | HR 81 | Temp 98.1°F | Resp 18

## 2024-03-26 DIAGNOSIS — M25572 Pain in left ankle and joints of left foot: Secondary | ICD-10-CM

## 2024-03-26 DIAGNOSIS — M79672 Pain in left foot: Secondary | ICD-10-CM

## 2024-03-26 MED ORDER — IBUPROFEN 800 MG PO TABS: 800.0000 mg | ORAL_TABLET | Freq: Three times a day (TID) | ORAL | 0 refills | Status: DC | PRN

## 2024-03-26 NOTE — Discharge Instructions (Addendum)
 The x-rays of your foot and ankle are read and do not show any broken bones.  Take ibuprofen  800 mg--1 tab every 8 hours as needed for pain.  You can also take Tylenol /acetaminophen  500 mg--2 tablets every 6 hours as needed for pain or fever  You can ice and elevate your foot.

## 2024-03-26 NOTE — ED Triage Notes (Signed)
 Pt coming due to twisted left ankle on Saturday. Has taken ibuprofen  with some relief. C/o swelling and pain.

## 2024-03-26 NOTE — ED Provider Notes (Signed)
 [facility]    CSN: [id number] Arrival date & time: 03/26/24  1154      History   Chief Complaint Chief Complaint  Patient presents with   Ankle Pain    Twisted my ankle at work - Entered by patient    HPI Dustin Christian is a 28 y.o. male.    Ankle Pain  Here for pain in his left lateral foot and some in his ankle.  On December 6 he was moving boxes when something fell out of the box and he stepped on it causing him to roll and inverted his left ankle.  He then fell onto his left foot.  Since then he has been having pain mainly in the lateral left foot and some on the inferior portion of the left ankle, inferior to the lateral malleolus.  It was swelling at first and that has improved some  He has been getting a little bit of relief from ibuprofen  400 mg  NKDA Past Medical History:  Diagnosis Date   Asthma    Seasonal allergies     There are no active problems to display for this patient.   History reviewed. No pertinent surgical history.     Home Medications    Prior to Admission medications   Medication Sig Start Date End Date Taking? Authorizing Provider  albuterol  (VENTOLIN  HFA) 108 (90 Base) MCG/ACT inhaler Inhale into the lungs every 6 (six) hours as needed for wheezing or shortness of breath.   Yes [provider]  ibuprofen  (ADVIL ) 800 MG tablet Take 1 tablet (800 mg total) by mouth every 8 (eight) hours as needed (pain). 03/26/24  Yes Vonna Sharlet POUR, MD  acetaminophen  (TYLENOL ) 500 MG tablet Take 500 mg by mouth every 6 (six) hours as needed for mild pain.    [provider]    Family History Family History  Problem Relation Age of Onset   Healthy Mother    Diabetes Father    Asthma Father    Hypertension Father     Social History Social History   Tobacco Use   Smoking status: Never   Smokeless tobacco: Never  Vaping Use   Vaping status: Never Used  Substance Use Topics   Alcohol use: No   Drug use: No      Allergies   Mushroom extract complex (obsolete)   Review of Systems Review of Systems   Physical Exam Triage Vital Signs ED Triage Vitals  Encounter Vitals Group     BP 03/26/24 1218 115/76     Girls Systolic BP Percentile --      Girls Diastolic BP Percentile --      Boys Systolic BP Percentile --      Boys Diastolic BP Percentile --      Pulse Rate 03/26/24 1218 81     Resp 03/26/24 1218 18     Temp 03/26/24 1218 98.1 F (36.7 C)     Temp src --      SpO2 03/26/24 1218 97 %     Weight --      Height --      Head Circumference --      Peak Flow --      Pain Score 03/26/24 1219 4     Pain Loc --      Pain Education --      Exclude from Growth Chart --    No data found.  Updated Vital Signs BP 115/76 (BP Location: Right Arm)  Pulse 81   Temp 98.1 F (36.7 C)   Resp 18   SpO2 97%   Visual Acuity Right Eye Distance:   Left Eye Distance:   Bilateral Distance:    Right Eye Near:   Left Eye Near:    Bilateral Near:     Physical Exam Vitals reviewed.  Constitutional:      General: He is not in acute distress.    Appearance: He is not ill-appearing, toxic-appearing or diaphoretic.  Musculoskeletal:     Comments: The lateral malleolus is not tender.  Pulses are normal.  No edema noted.  He mainly shows me the area that is painful to be inferior to the lateral malleolus and along the lateral part of the distal foot.  Skin:    Coloration: Skin is not jaundiced or pale.  Neurological:     General: No focal deficit present.     Mental Status: He is alert and oriented to person, place, and time.  Psychiatric:        Behavior: Behavior normal.      UC Treatments / Results  Labs (all labs ordered are listed, but only abnormal results are displayed) Labs Reviewed - No data to display  EKG   Radiology DG Foot Complete Left Result Date: 03/26/2024 CLINICAL DATA:  Lateral foot pain after an inversion injury and fall. EXAM: LEFT FOOT - COMPLETE 3+  VIEW COMPARISON:  None Available. FINDINGS: No acute osseous or joint abnormality. IMPRESSION: No acute findings. Electronically Signed   By: Newell Eke M.D.   On: 03/26/2024 13:12   DG Ankle Complete Left Result Date: 03/26/2024 CLINICAL DATA:  Twisting injury.  Swelling and pain. EXAM: LEFT ANKLE COMPLETE - 3+ VIEW COMPARISON:  None. FINDINGS: No acute osseous or joint abnormality.  Calcaneal spurs. IMPRESSION: No acute findings. Electronically Signed   By: Newell Eke M.D.   On: 03/26/2024 13:11    Procedures Procedures (including critical care time)  Medications Ordered in UC Medications - No data to display  Initial Impression / Assessment and Plan / UC Course  I have reviewed the triage vital signs and the nursing notes.  Pertinent labs & imaging results that were available during my care of the patient were reviewed by me and considered in my medical decision making (see chart for details).     X-rays of the foot and ankle are read as negative.  Crutches and a postop shoe are provided.  800 mg ibuprofen  prescription is sent to the pharmacy  He is given contact information for podiatry.  Final Clinical Impressions(s) / UC Diagnoses   Final diagnoses:  Left foot pain  Acute left ankle pain     Discharge Instructions      The x-rays of your foot and ankle are read and do not show any broken bones.  Take ibuprofen  800 mg--1 tab every 8 hours as needed for pain.  You can also take Tylenol /acetaminophen  500 mg--2 tablets every 6 hours as needed for pain or fever  You can ice and elevate your foot.     ED Prescriptions     Medication Sig Dispense Auth. Provider   ibuprofen  (ADVIL ) 800 MG tablet Take 1 tablet (800 mg total) by mouth every 8 (eight) hours as needed (pain). 21 tablet Mehak Roskelley K, MD      PDMP not reviewed this encounter.   Vonna Sharlet POUR, MD 03/26/24 1325

## 2024-03-28 ENCOUNTER — Ambulatory Visit: Payer: Self-pay | Admitting: Podiatry

## 2024-05-09 ENCOUNTER — Encounter (HOSPITAL_COMMUNITY): Payer: Self-pay

## 2024-05-09 ENCOUNTER — Ambulatory Visit (HOSPITAL_COMMUNITY)
Admission: RE | Admit: 2024-05-09 | Discharge: 2024-05-09 | Disposition: A | Discharge status: Home / Self Care | Payer: Self-pay | Source: Ambulatory Visit | Attending: Family Medicine | Admitting: Family Medicine

## 2024-05-09 VITALS — BP 117/76 | HR 109 | Temp 100.3°F | Resp 18

## 2024-05-09 DIAGNOSIS — J111 Influenza due to unidentified influenza virus with other respiratory manifestations: Secondary | ICD-10-CM

## 2024-05-09 LAB — POCT RAPID STREP A (OFFICE): Rapid Strep A Screen: NEGATIVE

## 2024-05-09 MED ORDER — PROMETHAZINE-DM 6.25-15 MG/5ML PO SYRP: 5.0000 mL | ORAL_SOLUTION | Freq: Four times a day (QID) | ORAL | 0 refills | Status: AC | PRN

## 2024-05-09 MED ORDER — ALBUTEROL SULFATE HFA 108 (90 BASE) MCG/ACT IN AERS: 1.0000 | INHALATION_SPRAY | Freq: Four times a day (QID) | RESPIRATORY_TRACT | 1 refills | Status: AC | PRN

## 2024-05-09 MED ORDER — FLUTICASONE PROPIONATE 50 MCG/ACT NA SUSP: 2.0000 | Freq: Every day | NASAL | 0 refills | Status: AC

## 2024-05-09 NOTE — Medical Student Note (Signed)
 [employer], Site Note For educational purposes for Medical, PA and NP students only and not part of the legal medical record.   CSN: [id number] Arrival date & time: 05/09/24  1755      History   Chief Complaint Chief Complaint  Patient presents with   APPT - headache    HPI Kevonte Vanecek is a 29 y.o. male with a hx of asthma and seasonal allergies.  He presents with a productive cough, sinus pressure, rhinorrhea, HA, subjective fever, and sore throat for 4 days. He denies ShOB, wheezing, N/V/D. He denies any sick contacts. He initially was taking APAP and ibuprofen  the first 2 days, but he stopped because the benefits would wear off.   HPI  Past Medical History:  Diagnosis Date   Asthma    Seasonal allergies     There are no active problems to display for this patient.   History reviewed. No pertinent surgical history.     Home Medications    Prior to Admission medications  Medication Sig Start Date End Date Taking? Authorizing Provider  acetaminophen  (TYLENOL ) 500 MG tablet Take 500 mg by mouth every 6 (six) hours as needed for mild pain.    [provider]  albuterol  (VENTOLIN  HFA) 108 (90 Base) MCG/ACT inhaler Inhale into the lungs every 6 (six) hours as needed for wheezing or shortness of breath.    [provider]  ibuprofen  (ADVIL ) 800 MG tablet Take 1 tablet (800 mg total) by mouth every 8 (eight) hours as needed (pain). 03/26/24   Vonna Sharlet POUR, MD    Family History Family History  Problem Relation Age of Onset   Healthy Mother    Diabetes Father    Asthma Father    Hypertension Father     Social History Social History[1]   Allergies   Mushroom extract complex (obsolete)   Review of Systems Review of Systems  Constitutional:  Positive for chills, fatigue and fever.  HENT:  Positive for congestion, rhinorrhea, sinus pressure and sore throat.   Respiratory:  Positive for cough. Negative for chest  tightness, shortness of breath and wheezing.   Cardiovascular:  Negative for chest pain.  Gastrointestinal:  Negative for abdominal pain, diarrhea, nausea and vomiting.  Musculoskeletal:  Positive for myalgias.  Neurological:  Positive for headaches.     Physical Exam Updated Vital Signs BP 117/76 (BP Location: Left Arm)   Pulse (!) 109   Temp 100.3 F (37.9 C) (Oral)   Resp 18   SpO2 94%   Physical Exam Constitutional:      Appearance: Normal appearance.  HENT:     Right Ear: Tympanic membrane, ear canal and external ear normal.     Left Ear: Tympanic membrane, ear canal and external ear normal.     Nose: Rhinorrhea present.     Mouth/Throat:     Mouth: Mucous membranes are moist.     Pharynx: Posterior oropharyngeal erythema present.  Cardiovascular:     Rate and Rhythm: Regular rhythm. Tachycardia present.     Pulses: Normal pulses.     Heart sounds: Normal heart sounds.  Pulmonary:     Effort: Pulmonary effort is normal.     Breath sounds: Normal breath sounds.  Abdominal:     General: Bowel sounds are normal.     Palpations: Abdomen is soft.     Tenderness: There is no abdominal tenderness.  Musculoskeletal:     Cervical back: Neck supple.  Lymphadenopathy:  Cervical: Cervical adenopathy present.  Skin:    General: Skin is warm and dry.     Capillary Refill: Capillary refill takes less than 2 seconds.  Neurological:     Mental Status: He is alert and oriented to person, place, and time.  Psychiatric:        Mood and Affect: Mood normal.        Behavior: Behavior normal.      ED Treatments / Results  Labs (all labs ordered are listed, but only abnormal results are displayed) Labs Reviewed  POCT RAPID STREP A (OFFICE)    EKG  Radiology No results found.  Procedures Procedures (including critical care time)  Medications Ordered in ED Medications - No data to display   Initial Impression / Assessment and Plan / ED Course  I have reviewed  the triage vital signs and the nursing notes.  Pertinent labs & imaging results that were available during my care of the patient were reviewed by me and considered in my medical decision making (see chart for details).    POC rapid strep was negative.   Influenza-like illness  Symptoms consistent with a flu like illness. Discussed flu testing, but pt declined testing at this juncture. Start fluticasone  nasal spray 50 mg/act with 2 sprays each nostrile daily. Promethazine -DM 6.25-15 mg/5 ml, 5 ml 4 times daily prn for cough. Albuterol  MDI 108 mcg/act, 1-2 puffs every 6 hours PRN for wheezing or ShOB. Counseled pt on alternating APAP and ibuprofen  every 3 hour prn for fever and pain.   Red flag symptoms reviewed and return precautions given.    Final Clinical Impressions(s) / ED Diagnoses   Final diagnoses:  None    New Prescriptions New Prescriptions   No medications on file       [1]  Social History Tobacco Use   Smoking status: Never   Smokeless tobacco: Never  Vaping Use   Vaping status: Never Used  Substance Use Topics   Alcohol use: No   Drug use: No   "

## 2024-05-09 NOTE — ED Triage Notes (Signed)
 Pt c/o productive cough with green mucus, face pressure, runny nose, headache, and sore throat x4 days. States taken aleve  and day/nyquil with no relief.

## 2024-05-12 NOTE — ED Provider Notes (Signed)
 " [facility]   CSN: [id number] Arrival date & time: 05/09/24  1755       History              Chief Complaint    Chief Complaint  Patient presents with   APPT - headache      HPI Dustin Christian is a 29 y.o. male with a hx of asthma and seasonal allergies.   He presents with a productive cough, sinus pressure, rhinorrhea, HA, subjective fever, and sore throat for 4 days. He denies ShOB, wheezing, N/V/D. He denies any sick contacts. He initially was taking APAP and ibuprofen  the first 2 days, but he stopped because the benefits would wear off.    HPI       Past Medical History:  Diagnosis Date   Asthma     Seasonal allergies            There are no active problems to display for this patient.     History reviewed. No pertinent surgical history.             Home Medications                       Prior to Admission medications  Medication Sig Start Date End Date Taking? Authorizing Provider  acetaminophen  (TYLENOL ) 500 MG tablet Take 500 mg by mouth every 6 (six) hours as needed for mild pain.       [provider]  albuterol  (VENTOLIN  HFA) 108 (90 Base) MCG/ACT inhaler Inhale into the lungs every 6 (six) hours as needed for wheezing or shortness of breath.       [provider]  ibuprofen  (ADVIL ) 800 MG tablet Take 1 tablet (800 mg total) by mouth every 8 (eight) hours as needed (pain). 03/26/24     Vonna Sharlet POUR, MD      Family History      Family History  Problem Relation Age of Onset   Healthy Mother     Diabetes Father     Asthma Father     Hypertension Father            Social History [Social History]  [Social History]     Tobacco Use   Smoking status: Never   Smokeless tobacco: Never  Vaping Use   Vaping status: Never Used  Substance Use Topics   Alcohol use: No   Drug use: No       Allergies              Mushroom extract complex (obsolete)     Review of Systems Review of Systems  Constitutional:   Positive for chills, fatigue and fever.  HENT:  Positive for congestion, rhinorrhea, sinus pressure and sore throat.   Respiratory:  Positive for cough. Negative for chest tightness, shortness of breath and wheezing.   Cardiovascular:  Negative for chest pain.  Gastrointestinal:  Negative for abdominal pain, diarrhea, nausea and vomiting.  Musculoskeletal:  Positive for myalgias.  Neurological:  Positive for headaches.       Physical Exam Updated Vital Signs BP 117/76 (BP Location: Left Arm)   Pulse (!) 109   Temp 100.3 F (37.9 C) (Oral)   Resp 18   SpO2 94%    Physical Exam Constitutional:      Appearance: Normal appearance.  HENT:     Right Ear: Tympanic membrane, ear canal and external ear normal.     Left Ear: Tympanic membrane, ear  canal and external ear normal.     Nose: Rhinorrhea present.     Mouth/Throat:     Mouth: Mucous membranes are moist.     Pharynx: Posterior oropharyngeal erythema present.  Cardiovascular:     Rate and Rhythm: Regular rhythm. Tachycardia present.     Pulses: Normal pulses.     Heart sounds: Normal heart sounds.  Pulmonary:     Effort: Pulmonary effort is normal.     Breath sounds: Normal breath sounds.  Abdominal:     General: Bowel sounds are normal.     Palpations: Abdomen is soft.     Tenderness: There is no abdominal tenderness.  Musculoskeletal:     Cervical back: Neck supple.  Lymphadenopathy:     Cervical: Cervical adenopathy present.  Skin:    General: Skin is warm and dry.     Capillary Refill: Capillary refill takes less than 2 seconds.  Neurological:     Mental Status: He is alert and oriented to person, place, and time.  Psychiatric:        Mood and Affect: Mood normal.        Behavior: Behavior normal.         ED Treatments / Results  Labs (all labs ordered are listed, but only abnormal results are displayed) Labs Reviewed  POCT RAPID STREP A (OFFICE)      EKG   Radiology Imaging Results (Last 48 hours)   No results found.     Procedures Procedures (including critical care time)   Medications Ordered in ED Medications - No data to display     Initial Impression / Assessment and Plan / ED Course  I have reviewed the triage vital signs and the nursing notes.   Pertinent labs & imaging results that were available during my care of the patient were reviewed by me and considered in my medical decision making (see chart for details).   POC rapid strep was negative.    Influenza-like illness   Symptoms consistent with a flu like illness. Discussed flu testing, but pt declined testing at this juncture. Start fluticasone  nasal spray 50 mg/act with 2 sprays each nostrile daily. Promethazine -DM 6.25-15 mg/5 ml, 5 ml 4 times daily prn for cough. Albuterol  MDI 108 mcg/act, 1-2 puffs every 6 hours PRN for wheezing or ShOB. Counseled pt on alternating APAP and ibuprofen  every 3 hour prn for fever and pain.    Red flag symptoms reviewed and return precautions given.      Final Clinical Impressions(s) / ED Diagnoses    1. Influenza-like illness       Meds ordered this encounter  Medications   albuterol  (VENTOLIN  HFA) 108 (90 Base) MCG/ACT inhaler    Sig: Inhale 1-2 puffs into the lungs every 6 (six) hours as needed for wheezing or shortness of breath.    Dispense:  1 each    Refill:  1   promethazine -dextromethorphan (PROMETHAZINE -DM) 6.25-15 MG/5ML syrup    Sig: Take 5 mLs by mouth 4 (four) times daily as needed for cough.    Dispense:  118 mL    Refill:  0   fluticasone  (FLONASE ) 50 MCG/ACT nasal spray    Sig: Place 2 sprays into both nostrils daily.    Dispense:  1 g    Refill:  0      Rolinda Rogue, MD 05/12/24 9184  "
